# Patient Record
Sex: Female | Born: 1945 | Race: White | Hispanic: No | Marital: Married | Smoking: Never smoker
Health system: Southern US, Community
[De-identification: ages and names within clinical notes are randomized; demographics above are authoritative.]

## PROBLEM LIST (undated history)

## (undated) DIAGNOSIS — Z952 Presence of prosthetic heart valve: Secondary | ICD-10-CM

## (undated) DIAGNOSIS — M810 Age-related osteoporosis without current pathological fracture: Secondary | ICD-10-CM

## (undated) DIAGNOSIS — S301XXA Contusion of abdominal wall, initial encounter: Secondary | ICD-10-CM

## (undated) DIAGNOSIS — I739 Peripheral vascular disease, unspecified: Secondary | ICD-10-CM

## (undated) DIAGNOSIS — E039 Hypothyroidism, unspecified: Secondary | ICD-10-CM

## (undated) DIAGNOSIS — Z7901 Long term (current) use of anticoagulants: Secondary | ICD-10-CM

## (undated) DIAGNOSIS — F419 Anxiety disorder, unspecified: Secondary | ICD-10-CM

## (undated) DIAGNOSIS — R42 Dizziness and giddiness: Secondary | ICD-10-CM

## (undated) DIAGNOSIS — K922 Gastrointestinal hemorrhage, unspecified: Secondary | ICD-10-CM

## (undated) DIAGNOSIS — F329 Major depressive disorder, single episode, unspecified: Secondary | ICD-10-CM

## (undated) DIAGNOSIS — I272 Pulmonary hypertension, unspecified: Secondary | ICD-10-CM

## (undated) DIAGNOSIS — I639 Cerebral infarction, unspecified: Secondary | ICD-10-CM

## (undated) DIAGNOSIS — G510 Bell's palsy: Secondary | ICD-10-CM

## (undated) DIAGNOSIS — M25512 Pain in left shoulder: Secondary | ICD-10-CM

## (undated) DIAGNOSIS — L309 Dermatitis, unspecified: Secondary | ICD-10-CM

## (undated) DIAGNOSIS — S3011XA Contusion of abdominal wall, initial encounter: Secondary | ICD-10-CM

## (undated) DIAGNOSIS — I4891 Unspecified atrial fibrillation: Secondary | ICD-10-CM

## (undated) DIAGNOSIS — J309 Allergic rhinitis, unspecified: Secondary | ICD-10-CM

## (undated) HISTORY — DX: Age-related osteoporosis without current pathological fracture: M81.0

## (undated) HISTORY — DX: Unspecified atrial fibrillation: I48.91

## (undated) HISTORY — DX: Peripheral vascular disease, unspecified: I73.9

## (undated) HISTORY — DX: Pain in left shoulder: M25.512

## (undated) HISTORY — DX: Dermatitis, unspecified: L30.9

## (undated) HISTORY — DX: Pulmonary hypertension, unspecified: I27.20

## (undated) HISTORY — DX: Presence of prosthetic heart valve: Z95.2

## (undated) HISTORY — DX: Cerebral infarction, unspecified: I63.9

## (undated) HISTORY — DX: Contusion of abdominal wall, initial encounter: S30.11XA

## (undated) HISTORY — DX: Hypothyroidism, unspecified: E03.9

## (undated) HISTORY — DX: Allergic rhinitis, unspecified: J30.9

## (undated) HISTORY — DX: Bell's palsy: G51.0

## (undated) HISTORY — DX: Contusion of abdominal wall, initial encounter: S30.1XXA

## (undated) HISTORY — DX: Anxiety disorder, unspecified: F41.9

## (undated) HISTORY — DX: Dizziness and giddiness: R42

## (undated) HISTORY — DX: Major depressive disorder, single episode, unspecified: F32.9

## (undated) HISTORY — DX: Long term (current) use of anticoagulants: Z79.01

---

## 1998-06-25 ENCOUNTER — Ambulatory Visit (HOSPITAL_COMMUNITY): Admission: RE | Admit: 1998-06-25 | Discharge: 1998-06-25 | Payer: Self-pay | Admitting: Cardiology

## 1999-07-01 ENCOUNTER — Emergency Department (HOSPITAL_COMMUNITY): Admission: EM | Admit: 1999-07-01 | Discharge: 1999-07-01 | Payer: Self-pay | Admitting: *Deleted

## 1999-07-01 ENCOUNTER — Encounter: Payer: Self-pay | Admitting: Emergency Medicine

## 1999-07-02 ENCOUNTER — Encounter: Payer: Self-pay | Admitting: Emergency Medicine

## 1999-07-07 ENCOUNTER — Encounter: Payer: Self-pay | Admitting: Pulmonary Disease

## 1999-07-23 ENCOUNTER — Ambulatory Visit (HOSPITAL_COMMUNITY): Admission: RE | Admit: 1999-07-23 | Discharge: 1999-07-24 | Payer: Self-pay | Admitting: Cardiology

## 1999-07-24 ENCOUNTER — Encounter: Payer: Self-pay | Admitting: Surgery

## 1999-07-25 ENCOUNTER — Encounter (HOSPITAL_COMMUNITY): Admission: RE | Admit: 1999-07-25 | Discharge: 1999-10-23 | Payer: Self-pay | Admitting: Dentistry

## 1999-07-28 ENCOUNTER — Encounter: Payer: Self-pay | Admitting: Cardiothoracic Surgery

## 1999-07-29 ENCOUNTER — Inpatient Hospital Stay (HOSPITAL_COMMUNITY): Admission: RE | Admit: 1999-07-29 | Discharge: 1999-08-03 | Payer: Self-pay | Admitting: Cardiothoracic Surgery

## 1999-07-29 ENCOUNTER — Encounter: Payer: Self-pay | Admitting: Cardiothoracic Surgery

## 1999-07-30 ENCOUNTER — Encounter: Payer: Self-pay | Admitting: Cardiothoracic Surgery

## 1999-07-31 ENCOUNTER — Encounter: Payer: Self-pay | Admitting: Cardiothoracic Surgery

## 1999-08-16 ENCOUNTER — Emergency Department (HOSPITAL_COMMUNITY): Admission: EM | Admit: 1999-08-16 | Discharge: 1999-08-16 | Payer: Self-pay | Admitting: Emergency Medicine

## 1999-08-16 ENCOUNTER — Encounter: Payer: Self-pay | Admitting: Emergency Medicine

## 1999-09-19 ENCOUNTER — Encounter: Payer: Self-pay | Admitting: Pulmonary Disease

## 1999-09-30 ENCOUNTER — Ambulatory Visit: Admission: RE | Admit: 1999-09-30 | Discharge: 1999-09-30 | Payer: Self-pay | Admitting: Pulmonary Disease

## 1999-09-30 ENCOUNTER — Encounter: Payer: Self-pay | Admitting: Pulmonary Disease

## 1999-10-06 ENCOUNTER — Encounter: Payer: Self-pay | Admitting: Pulmonary Disease

## 1999-10-28 ENCOUNTER — Inpatient Hospital Stay (HOSPITAL_COMMUNITY): Admission: EM | Admit: 1999-10-28 | Discharge: 1999-10-30 | Payer: Self-pay | Admitting: *Deleted

## 2000-01-20 ENCOUNTER — Emergency Department (HOSPITAL_COMMUNITY): Admission: EM | Admit: 2000-01-20 | Discharge: 2000-01-20 | Payer: Self-pay | Admitting: Emergency Medicine

## 2000-10-05 ENCOUNTER — Encounter (HOSPITAL_COMMUNITY): Admission: RE | Admit: 2000-10-05 | Discharge: 2000-11-23 | Payer: Self-pay | Admitting: Cardiology

## 2000-11-13 ENCOUNTER — Emergency Department (HOSPITAL_COMMUNITY): Admission: EM | Admit: 2000-11-13 | Discharge: 2000-11-13 | Payer: Self-pay | Admitting: Emergency Medicine

## 2001-02-12 ENCOUNTER — Emergency Department (HOSPITAL_COMMUNITY): Admission: EM | Admit: 2001-02-12 | Discharge: 2001-02-12 | Payer: Self-pay | Admitting: Emergency Medicine

## 2001-02-12 ENCOUNTER — Encounter: Payer: Self-pay | Admitting: Emergency Medicine

## 2001-07-05 ENCOUNTER — Inpatient Hospital Stay (HOSPITAL_COMMUNITY): Admission: EM | Admit: 2001-07-05 | Discharge: 2001-07-09 | Payer: Self-pay | Admitting: *Deleted

## 2001-07-08 ENCOUNTER — Encounter: Payer: Self-pay | Admitting: Internal Medicine

## 2002-07-02 ENCOUNTER — Emergency Department (HOSPITAL_COMMUNITY): Admission: EM | Admit: 2002-07-02 | Discharge: 2002-07-02 | Payer: Self-pay | Admitting: Emergency Medicine

## 2003-07-10 ENCOUNTER — Emergency Department (HOSPITAL_COMMUNITY): Admission: EM | Admit: 2003-07-10 | Discharge: 2003-07-10 | Payer: Self-pay | Admitting: Emergency Medicine

## 2004-02-19 ENCOUNTER — Encounter (HOSPITAL_COMMUNITY): Admission: RE | Admit: 2004-02-19 | Discharge: 2004-05-19 | Payer: Self-pay | Admitting: *Deleted

## 2004-02-21 ENCOUNTER — Inpatient Hospital Stay (HOSPITAL_COMMUNITY): Admission: EM | Admit: 2004-02-21 | Discharge: 2004-02-23 | Payer: Self-pay | Admitting: Emergency Medicine

## 2004-02-26 ENCOUNTER — Ambulatory Visit (HOSPITAL_COMMUNITY): Admission: RE | Admit: 2004-02-26 | Discharge: 2004-02-26 | Payer: Self-pay | Admitting: *Deleted

## 2004-06-29 ENCOUNTER — Emergency Department (HOSPITAL_COMMUNITY): Admission: EM | Admit: 2004-06-29 | Discharge: 2004-06-29 | Payer: Self-pay | Admitting: Emergency Medicine

## 2004-07-02 ENCOUNTER — Emergency Department (HOSPITAL_COMMUNITY): Admission: EM | Admit: 2004-07-02 | Discharge: 2004-07-03 | Payer: Self-pay | Admitting: Emergency Medicine

## 2004-07-04 ENCOUNTER — Inpatient Hospital Stay (HOSPITAL_COMMUNITY): Admission: EM | Admit: 2004-07-04 | Discharge: 2004-07-13 | Payer: Self-pay | Admitting: Emergency Medicine

## 2004-07-24 ENCOUNTER — Encounter: Admission: RE | Admit: 2004-07-24 | Discharge: 2004-07-24 | Payer: Self-pay | Admitting: Internal Medicine

## 2005-03-27 ENCOUNTER — Emergency Department (HOSPITAL_COMMUNITY): Admission: EM | Admit: 2005-03-27 | Discharge: 2005-03-27 | Payer: Self-pay | Admitting: Emergency Medicine

## 2005-05-24 ENCOUNTER — Emergency Department (HOSPITAL_COMMUNITY): Admission: EM | Admit: 2005-05-24 | Discharge: 2005-05-24 | Payer: Self-pay | Admitting: Emergency Medicine

## 2007-04-11 ENCOUNTER — Encounter: Admission: RE | Admit: 2007-04-11 | Discharge: 2007-04-11 | Payer: Self-pay | Admitting: Internal Medicine

## 2007-04-18 ENCOUNTER — Other Ambulatory Visit: Admission: RE | Admit: 2007-04-18 | Discharge: 2007-04-18 | Payer: Self-pay | Admitting: Obstetrics and Gynecology

## 2007-05-09 ENCOUNTER — Encounter: Admission: RE | Admit: 2007-05-09 | Discharge: 2007-05-09 | Payer: Self-pay | Admitting: Internal Medicine

## 2007-09-15 ENCOUNTER — Ambulatory Visit: Payer: Self-pay | Admitting: *Deleted

## 2008-03-21 ENCOUNTER — Ambulatory Visit: Payer: Self-pay | Admitting: Pulmonary Disease

## 2008-03-21 DIAGNOSIS — R0602 Shortness of breath: Secondary | ICD-10-CM | POA: Insufficient documentation

## 2008-03-21 DIAGNOSIS — I059 Rheumatic mitral valve disease, unspecified: Secondary | ICD-10-CM | POA: Insufficient documentation

## 2008-03-23 ENCOUNTER — Encounter (INDEPENDENT_AMBULATORY_CARE_PROVIDER_SITE_OTHER): Payer: Self-pay | Admitting: *Deleted

## 2008-03-26 ENCOUNTER — Ambulatory Visit: Payer: Self-pay | Admitting: Pulmonary Disease

## 2008-03-27 ENCOUNTER — Encounter: Payer: Self-pay | Admitting: Pulmonary Disease

## 2008-04-10 ENCOUNTER — Telehealth (INDEPENDENT_AMBULATORY_CARE_PROVIDER_SITE_OTHER): Payer: Self-pay | Admitting: *Deleted

## 2008-04-16 ENCOUNTER — Ambulatory Visit: Payer: Self-pay | Admitting: Pulmonary Disease

## 2009-01-02 ENCOUNTER — Ambulatory Visit: Payer: Self-pay | Admitting: Vascular Surgery

## 2009-11-24 ENCOUNTER — Inpatient Hospital Stay (HOSPITAL_COMMUNITY): Admission: EM | Admit: 2009-11-24 | Discharge: 2009-11-27 | Payer: Self-pay | Admitting: Emergency Medicine

## 2009-11-24 ENCOUNTER — Emergency Department (HOSPITAL_COMMUNITY): Admission: EM | Admit: 2009-11-24 | Discharge: 2009-11-24 | Payer: Self-pay | Admitting: Emergency Medicine

## 2009-11-25 ENCOUNTER — Ambulatory Visit: Payer: Self-pay | Admitting: Vascular Surgery

## 2009-11-25 ENCOUNTER — Encounter (INDEPENDENT_AMBULATORY_CARE_PROVIDER_SITE_OTHER): Payer: Self-pay | Admitting: Internal Medicine

## 2010-01-02 ENCOUNTER — Encounter: Admission: RE | Admit: 2010-01-02 | Discharge: 2010-01-02 | Payer: Self-pay | Admitting: Orthopaedic Surgery

## 2010-01-17 ENCOUNTER — Encounter: Admission: RE | Admit: 2010-01-17 | Discharge: 2010-01-17 | Payer: Self-pay | Admitting: Gastroenterology

## 2010-05-08 ENCOUNTER — Encounter
Admission: RE | Admit: 2010-05-08 | Discharge: 2010-06-03 | Payer: Self-pay | Source: Home / Self Care | Attending: Orthopaedic Surgery | Admitting: Orthopaedic Surgery

## 2010-05-25 ENCOUNTER — Encounter: Payer: Self-pay | Admitting: Orthopaedic Surgery

## 2010-05-25 ENCOUNTER — Encounter: Payer: Self-pay | Admitting: Internal Medicine

## 2010-06-03 NOTE — Progress Notes (Signed)
----   Converted from flag ---- ---- 04/07/2008 12:30 PM, Barbaraann Share MD wrote: pt needs ov to discuss pfts ------------------------------  LMOMTCB.  Cyndia Diver LPN  April 10, 2008 2:24 PM   called and spoke with pt. pt is scheduled to see [facility] on monday at 2:00pm.  Cyndia Diver LPN  April 13, 2008 4:42 PM

## 2010-06-03 NOTE — Assessment & Plan Note (Signed)
Summary: ov to discuss PFT results/mg   PCP:  Donette Larry  Chief Complaint:  Pt is here for a f/u appt to discuss PFT results. .  History of Present Illness: the pt comes in today for f/u after her recent pfts and echo for w/u of doe.  Her pfts were totally normal, and her echo showed no significant pulmonary hypertension or abnormality of her prosthetic valve.  I have reviewed the pfts with the pt is great detail, and answered all of her questions.     Current Allergies: ! THORAZINE ! ADHESIVE TAPE      Vital Signs:  Patient Profile:   65 Years Old Female Weight:      142.13 pounds O2 Sat:      99 % O2 treatment:    Room Air Temp:     97.8 degrees F oral Pulse rate:   99 / minute BP sitting:   110 / 70  (right arm) Cuff size:   regular  Vitals Entered By: Cyndia Diver LPN (April 16, 2008 2:03 PM)             Comments Medications reviewed with patient Cyndia Diver LPN  April 16, 2008 2:03 PM      Physical Exam  General:     thin female in nad      Impression & Recommendations:  Problem # 1:  DYSPNEA (ICD-786.05) I can find no specific pulmonary etiology for the pt's doe.  She has a normal cxr, clear lung fields, and normal full pfts.  Her echo shows no pulmonary htn or abn. of her prosthetic valve.  At this point, I have asked her to work on some type of conditioning program for the next 2-3 mos and see how her symptoms change.  I would also wonder how much anxiety may be playing a role.   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

## 2010-06-03 NOTE — Letter (Signed)
Summary: [facility] Results Follow Up Letter  [facility]  [address]   [place], [place] [address]   Phone: [phone]  Fax: [phone]    03/23/2008 MRN: [id number]  [facility] [address] [place], [place]  [address]  Dear Ms. Carbonell,  We have received the results from your recent tests and have been unable to contact you.  Please call our office at [phone] so that Dr. Shelle Iron or his nurse may review the results with you.    Thank you,  Nature conservation officer Pulmonary Division

## 2010-06-03 NOTE — Progress Notes (Signed)
Summary: PFT normal,possible early hypothyroidism/LEB-Pulmo  PFT normal,possible early hypothyroidism/LEB-Pulmo   Imported By: Lester [place] 03/22/2008 08:26:58  _____________________________________________________________________  External Attachment:    Type:   Image     Comment:   External Document

## 2010-06-03 NOTE — Miscellaneous (Signed)
Summary: Orders Update  Clinical Lists Changes  Orders: Added new Service order of Carbon Monoxide diffusing w/capacity (94720) - Signed Added new Service order of Lung Volumes (94240) - Signed Added new Service order of Spirometry (Pre & Post) (94060) - Signed 

## 2010-06-03 NOTE — Progress Notes (Signed)
Summary: Dyspnea,fatigue,restrictive physiology by spirometry/LEB-Pulmo  Dyspnea,fatigue,restrictive physiology by spirometry/LEB-Pulmo   Imported By: Lester [place] 03/22/2008 08:24:22  _____________________________________________________________________  External Attachment:    Type:   Image     Comment:   External Document

## 2010-06-03 NOTE — Progress Notes (Signed)
Summary: dyspnea,pulmo hypertension,abn cxr/LEB-Pulmo  dyspnea,pulmo hypertension,abn cxr/LEB-Pulmo   Imported By: Lester [place] 03/22/2008 08:20:19  _____________________________________________________________________  External Attachment:    Type:   Image     Comment:   External Document

## 2010-06-03 NOTE — Assessment & Plan Note (Signed)
Summary: consult for dyspnea   Referred by:  Georgann Housekeeper PCP:  Georgann Housekeeper  Chief Complaint:  Pulmonary Consult.  History of Present Illness: the patient is a 65 year old female who I been asked to see for dyspnea. The patient gives a history for worsening shortness of breath over the last 3 months, but does not have dyspnea at rest. She describes less than one block dyspnea on exertion at a moderate pace on flat ground. She will also get winded bringing groceries in from the car, and vacuuming less than one room of her house. She has a history of PFTs in 2001 which were normal. She also has a history of mitral valve replacement, and is to have an echo with her cardiologist next week. She has not had a recent chest x-ray. The patient denies any significant cough or mucus production. She does state that her heart is "not beating right". She denies any worsening lower extremity edema.     Current Allergies: ! THORAZINE ! ADHESIVE TAPE  Past Medical History:    mitral valve stenosis, status post mitral valve, replacement, mechanical    Chronic Coumadin therapy    Depression    Hypothyroid    lower back pain    anxiety    rhinitis    myotonia, right eye    anemia   Family History:    emphysema: maternal grandfather    allergies: mother, father, siblings    heart disease: father (m.i. and stroke)    cancer: mother (breast)   Social History:    Patient never smoked.     pt is separated.    pt has children.    pt is currently on disability.    Risk Factors:  Tobacco use:  never   Review of Systems      See HPI   Vital Signs:  Patient Profile:   65 Years Old Female Weight:      144.13 pounds O2 Sat:      100 % O2 treatment:    Room Air Temp:     97.8 degrees F oral Pulse rate:   94 / minute BP sitting:   130 / 70  (right arm) Cuff size:   regular  Vitals Entered By: Cyndia Diver LPN (March 21, 2008 2:24 PM)             Is Patient Diabetic?  No Comments Medications reviewed with patient Cyndia Diver LPN  March 21, 2008 2:35 PM      Physical Exam  General:     thin female in nad Eyes:     PERRLA and EOMI.   Nose:     patent without discharge Mouth:     clear Neck:     no jvd, tmg, ln Lungs:     clear to auscultation, no wheezing or rhonchi Heart:     rrr, +valve click Abdomen:     soft and nontender, bs present Extremities:     no edema, pulses intact distally Neurologic:     alert and oriented, moves all 4 extremities.      Impression & Recommendations:  Problem # 1:  DYSPNEA (ICD-786.05) the pt complains of worsening dyspnea over the last 3mos.  She states that she had similar symptoms when she was having difficulties with her mitral valve.  There is nothing to suggest a pulmonary issue by history or exam, but will check cxr and pfts for completeness.  She is seeing her cardiologist next week to get an  echocardiogram.  Hopefully, this will give [place] some idea of her MV functioning, and an estimation of her PA pressures.   Patient Instructions: 1)  will check cxr today 2)  will schedule for breathing tests. 3)  I will contact you once results are available.   ]

## 2010-07-19 LAB — BASIC METABOLIC PANEL
BUN: 9 mg/dL (ref 6–23)
BUN: 9 mg/dL (ref 6–23)
CO2: 20 mEq/L (ref 19–32)
CO2: 25 mEq/L (ref 19–32)
Calcium: 9.4 mg/dL (ref 8.4–10.5)
Calcium: 9.7 mg/dL (ref 8.4–10.5)
Chloride: 104 mEq/L (ref 96–112)
Chloride: 107 mEq/L (ref 96–112)
Creatinine, Ser: 0.74 mg/dL (ref 0.4–1.2)
Creatinine, Ser: 0.79 mg/dL (ref 0.4–1.2)
GFR calc Af Amer: >60 mL/min (ref >60)
GFR calc Af Amer: >60 mL/min (ref >60)
GFR calc non Af Amer: >60 mL/min (ref >60)
GFR calc non Af Amer: >60 mL/min (ref >60)
Glucose, Bld: 104 mg/dL — ABNORMAL HIGH (ref 70–99)
Glucose, Bld: 122 mg/dL — ABNORMAL HIGH (ref 70–99)
Potassium: 4 mEq/L (ref 3.5–5.1)
Potassium: 4.2 mEq/L (ref 3.5–5.1)
Sodium: 137 mEq/L (ref 135–145)
Sodium: 138 mEq/L (ref 135–145)

## 2010-07-19 LAB — CBC
HCT: 42 % (ref 36.0–46.0)
HCT: 43.5 % (ref 36.0–46.0)
Hemoglobin: 14.6 g/dL (ref 12.0–15.0)
Hemoglobin: 15.1 g/dL — ABNORMAL HIGH (ref 12.0–15.0)
MCH: 29.9 pg (ref 26.0–34.0)
MCH: 30.4 pg (ref 26.0–34.0)
MCHC: 34.7 g/dL (ref 30.0–36.0)
MCHC: 34.8 g/dL (ref 30.0–36.0)
MCV: 86.2 fL (ref 78.0–100.0)
MCV: 87.2 fL (ref 78.0–100.0)
Platelets: 235 10*3/uL (ref 150–400)
Platelets: 250 10*3/uL (ref 150–400)
RBC: 4.88 MIL/uL (ref 3.87–5.11)
RBC: 4.99 MIL/uL (ref 3.87–5.11)
RDW: 13 % (ref 11.5–15.5)
RDW: 13.2 % (ref 11.5–15.5)
WBC: 7 10*3/uL (ref 4.0–10.5)
WBC: 7.4 10*3/uL (ref 4.0–10.5)

## 2010-07-19 LAB — POCT CARDIAC MARKERS
CKMB, poc: 1.8 ng/mL (ref 1.0–8.0)
CKMB, poc: 1.9 ng/mL (ref 1.0–8.0)
Myoglobin, poc: 103 ng/mL (ref 12–200)
Myoglobin, poc: 95.4 ng/mL (ref 12–200)
Troponin i, poc: <0.05 ng/mL (ref 0.00–0.09)
Troponin i, poc: <0.05 ng/mL (ref 0.00–0.09)

## 2010-07-19 LAB — CARDIAC PANEL(CRET KIN+CKTOT+MB+TROPI)
CK, MB: 2.2 ng/mL (ref 0.3–4.0)
CK, MB: 2.4 ng/mL (ref 0.3–4.0)
Relative Index: INVALID (ref 0.0–2.5)
Relative Index: INVALID (ref 0.0–2.5)
Total CK: 80 U/L (ref 7–177)
Total CK: 94 U/L (ref 7–177)
Troponin I: 0.01 ng/mL (ref 0.00–0.06)
Troponin I: 0.01 ng/mL (ref 0.00–0.06)

## 2010-07-19 LAB — TSH
TSH: 3.974 u[IU]/mL (ref 0.350–4.500)
TSH: 4.132 u[IU]/mL (ref 0.350–4.500)

## 2010-07-19 LAB — PROTIME-INR
INR: 2.99 — ABNORMAL HIGH (ref 0.00–1.49)
INR: 3.14 — ABNORMAL HIGH (ref 0.00–1.49)
INR: 3.36 — ABNORMAL HIGH (ref 0.00–1.49)
INR: 3.62 — ABNORMAL HIGH (ref 0.00–1.49)
Prothrombin Time: 30.8 seconds — ABNORMAL HIGH (ref 11.6–15.2)
Prothrombin Time: 32 seconds — ABNORMAL HIGH (ref 11.6–15.2)
Prothrombin Time: 33.8 seconds — ABNORMAL HIGH (ref 11.6–15.2)
Prothrombin Time: 35.8 seconds — ABNORMAL HIGH (ref 11.6–15.2)

## 2010-07-19 LAB — DIFFERENTIAL
Basophils Absolute: 0.1 10*3/uL (ref 0.0–0.1)
Basophils Relative: 1 % (ref 0–1)
Eosinophils Absolute: 0.1 10*3/uL (ref 0.0–0.7)
Eosinophils Relative: 2 % (ref 0–5)
Lymphocytes Relative: 26 % (ref 12–46)
Lymphs Abs: 1.8 10*3/uL (ref 0.7–4.0)
Monocytes Absolute: 0.6 10*3/uL (ref 0.1–1.0)
Monocytes Relative: 8 % (ref 3–12)
Neutro Abs: 4.4 10*3/uL (ref 1.7–7.7)
Neutrophils Relative %: 63 % (ref 43–77)

## 2010-07-19 LAB — POCT I-STAT, CHEM 8
BUN: 13 mg/dL (ref 6–23)
Calcium, Ion: 1.1 mmol/L — ABNORMAL LOW (ref 1.12–1.32)
Chloride: 107 mEq/L (ref 96–112)
Creatinine, Ser: 0.8 mg/dL (ref 0.4–1.2)
Glucose, Bld: 132 mg/dL — ABNORMAL HIGH (ref 70–99)
HCT: 44 % (ref 36.0–46.0)
Hemoglobin: 15 g/dL (ref 12.0–15.0)
Potassium: 4.2 mEq/L (ref 3.5–5.1)
Sodium: 140 mEq/L (ref 135–145)
TCO2: 22 mmol/L (ref 0–100)

## 2010-07-19 LAB — URINE MICROSCOPIC-ADD ON

## 2010-07-19 LAB — URINALYSIS, ROUTINE W REFLEX MICROSCOPIC
Bilirubin Urine: NEGATIVE
Glucose, UA: NEGATIVE mg/dL
Ketones, ur: NEGATIVE mg/dL
Leukocytes, UA: NEGATIVE
Nitrite: NEGATIVE
Protein, ur: NEGATIVE mg/dL
Specific Gravity, Urine: 1.005 (ref 1.005–1.030)
Urobilinogen, UA: 0.2 mg/dL (ref 0.0–1.0)
pH: 5.5 (ref 5.0–8.0)

## 2010-07-19 LAB — LIPID PANEL
Cholesterol: 180 mg/dL (ref 0–200)
HDL: 35 mg/dL — ABNORMAL LOW (ref >39)
LDL Cholesterol: 91 mg/dL (ref 0–99)
Total CHOL/HDL Ratio: 5.1 RATIO
Triglycerides: 268 mg/dL — ABNORMAL HIGH (ref <150)
VLDL: 54 mg/dL — ABNORMAL HIGH (ref 0–40)

## 2010-07-19 LAB — URINE CULTURE
Colony Count: NO GROWTH
Culture: NO GROWTH

## 2010-07-19 LAB — CK TOTAL AND CKMB (NOT AT ARMC)
CK, MB: 3 ng/mL (ref 0.3–4.0)
Relative Index: 2.8 — ABNORMAL HIGH (ref 0.0–2.5)
Total CK: 107 U/L (ref 7–177)

## 2010-07-19 LAB — HEMOGLOBIN A1C
Hgb A1c MFr Bld: 4.9 % (ref <5.7)
Mean Plasma Glucose: 94 mg/dL (ref <117)

## 2010-07-19 LAB — T4, FREE: Free T4: 1.58 ng/dL (ref 0.80–1.80)

## 2010-07-19 LAB — ALBUMIN: Albumin: 4.3 g/dL (ref 3.5–5.2)

## 2010-07-19 LAB — VITAMIN B12: Vitamin B-12: 404 pg/mL (ref 211–911)

## 2010-07-19 LAB — APTT: aPTT: 56 seconds — ABNORMAL HIGH (ref 24–37)

## 2010-07-19 LAB — TROPONIN I: Troponin I: 0.01 ng/mL (ref 0.00–0.06)

## 2010-09-16 NOTE — Assessment & Plan Note (Signed)
OFFICE VISIT   ONESTI, BONFIGLIO  DOB:  1945/12/16                                       01/02/2009  FAOZH#:[id number]   The patient is a 65 year old female referred by Dr. Magnus Ivan to evaluate  bluish discoloration of her toes.  She states that she gets some  cramping in her legs when lying down but this is usually improved with  walking.  She does not have any symptoms of claudication when  ambulating.  States that occasionally her right foot aches.  This  usually improves with rubbing it.  She also has a burning sensation in  her foot at times.  She denies history of alcohol abuse or diabetes.  She also has a history of peripheral neuropathy.   PAST SURGICAL HISTORY:  She had mitral valve replacement in 2001 and is  currently on Coumadin for that.   PAST MEDICAL HISTORY:  Otherwise fairly unremarkable.   FAMILY HISTORY:  Remarkable for her father who had vascular disease at a  young age.   SOCIAL HISTORY:  She is disabled due to dyspnea with exertion.  She is a  nonsmoker, nonconsumer of alcohol.   REVIEW OF SYSTEMS:  She is 5 feet 9 inches, 130 pounds.  CARDIAC:  She has dyspnea with exertion and walking.  She also has a  history of a murmur.  NEUROLOGIC:  She has some occasional headaches and dizziness.  ORTHOPEDIC:  She has multiple joint and arthritis pain.  PSYCHIATRIC:  She has mild anxiety and depression.  ENT, HEMATOLOGIC, RENAL, GI, PULMONARY REVIEW OF SYSTEMS:  Are all  negative.   MEDICATIONS:  Coumadin 2.5 mg once a day, Levoxyl 75 mg once a day,  Cymbalta 60 mg once a day.  She has no known drug allergies.   PHYSICAL EXAM:  Blood pressure is 142/83 in the right arm, pulse is 80  and regular.  HEENT:  Unremarkable.  Neck:  Has 2+ carotid pulses  without bruit.  Chest:  Clear to auscultation.  Cardiac exam:  Regular  rate and rhythm with no appreciable murmur.  Abdomen:  Soft, nontender,  no masses.  Extremities:  She has slight  decreased sensation in the  right plantar aspect of her foot.  She also has a bluish congested  appearance to the right foot.  She has 2+ radial and femoral pulses  bilaterally.  In the right foot she has a 1+ dorsalis pedis pulse with  absent posterior tibial pulse.  In the left foot she has a 1+ dorsalis  pedis pulse and a 2+ posterior tibial pulse.  She has no ulcerations on  the feet.  She has no significant edema in the extremities.   She had bilateral ABIs performed today which were 1.11 on the right and  1.07 on the left.  Waveforms are biphasic in character, these  represented normal ABIs bilaterally.   In summary, I agree with Dr. Magnus Ivan that the patient certainly has  some signs and symptoms of peripheral neuropathy.  The bluish  discoloration in her foot also may be due to some autonomic dysfunction.  I believe the best option for her would be continued conservative  management; however, I did write her a prescription today for Neurontin  to see if we could alleviate some of her neuropathic symptoms in her  feet.  She does  not seem to have any significant arterial occlusive  disease causing the bluish discoloration in her foot.  I reassured her  that she is not at risk of limb loss currently with the normal ABIs that  she has in her lower extremities.  She will follow up with me on an as-  needed basis.   Janetta Hora. Fields, MD  Electronically Signed   CEF/MEDQ  D:  01/09/2009  T:  01/10/2009  Job:  [id number]   cc:   Vanita Panda. Magnus Ivan, M.D.  Georgann Housekeeper, MD

## 2010-09-19 NOTE — H&P (Signed)
Jasmine Kelley, OVERLEY NO.:  [id number]   MEDICAL RECORD NO.:  [id number]          PATIENT TYPE:  EMS   LOCATION:  MAJO                         FACILITY:  [facility]   PHYSICIAN:  Nelva Nay, MD      DATE OF BIRTH:  1946/04/13   DATE OF ADMISSION:  02/21/2004  DATE OF DISCHARGE:                                HISTORY & PHYSICAL   PRIMARY CARE PHYSICIAN:  Jasmine Housekeeper, MD   CARDIOLOGIST:  Jasmine Kelley, M.D.   ADMITTING PHYSICIAN:  Jasmine Kelley, M.D.   CHIEF COMPLAINT:  Syncope and dizziness.   HISTORY OF PRESENT ILLNESS:  Jasmine Kelley is a 65 year old female patient  well known to me from our initial in the office last week on February 12, 2004. She came to the cardiologist with a known history of mitral valve  replacement and with complaints with persistent dyspnea and atypical chest  pain. She underwent an adenosine, Cardiolite study on February 19, 2004, that  demonstrated mild fixed thinning of the anterior apical apex and a small  perfusion defect of the anterolateral region which was suspicious for  ischemia. There was also noted to be moderate hypokinesis of the base of the  inferior wall. Due to the patient's St. Jude valve she is on chronic  Coumadin anticoagulation and plans are to proceed with catheterization today  on February 21, 2004, but unfortunately pre-cath labs drawn on October 19th  demonstrated a supertherapeutic INR of 4.93. The catheterization was  canceled and Coumadin was placed on hold. The patient states her last dose  was in the morning of October 19th. On her last visit to the office on  February 12, 2004, the patient was started on Lasix 20 mg daily due to  complaints of dyspnea, but she states she has only taken two doses, one  yesterday morning and one this morning.   In regards to today's symptoms, she was at home alone, was sitting on the  couch having some of  her usual type of leg cramps and felt the need to  void. She  walked to the bathroom, was feeling somewhat dizzy prior to going  into the bathroom, but nothing severe. She voided without difficulty. No  nausea or any symptoms while seated and voided. She began walking out of the  bathroom when she became extremely dizzy, nauseous, and slumped to the floor  over on her right side sustaining some mild bruising. Loss of consciousness  was less than one minute. The patient was fully aware she did not strike her  head. She got up immediately to her knees, regained her ability to stand,  was somewhat dizzy, but was able to get to her couch. She still had the cell  phone in her hand. She laid down, called EMS, and then called our office to  notify them of what had happened and then subsequently placed herself in a  supine position on the couch at which point dizziness resolved.  The patient  arrived to the ER and symptoms had resolved. She did report that once she  got to the couch  she had some atypical left lateral chest tightness that she  described as flutter-like, similar to prior symptoms she described in the  consultation note from our office of February 12, 2004. She reported to me  that prior to dizziness she was not having any chest pain or palpitations,  but later remembered when speaking with Dr. Mayford Knife that after the episode  she had a sensation of palpitations in her chest.   REVIEW OF SYSTEMS:  Essentially unchanged from the consultation note of  February 12, 2004. She is complaining of small superficial injuries to the  right upper extremity as well as to the right lower extremity, i.e., the  right elbow and right lateral leg near the knee. Again, she was complaining  of palpation after the episode, diffuse muscle aches with numbness and  tingling of the fingers and toes.  She denies any blood in urine or stools.  There is a question as to whether she is having some bleeding only when she  wipes at the rectal area.   FAMILY MEDICAL HISTORY:   Both of her sisters have depression. Mother died of  breast cancer in 88. Father died of heart disease and had diabetes at age  15.   SOCIAL HISTORY:  No tobacco use, only socially as a teenager. No alcohol.  Disabled secondary to dyspnea.   PAST MEDICAL HISTORY:  1.  St. Jude mitral valve replacement secondary to mitral stenosis in 2001,      Dr. Aleen Kelley.  2.  Chronic Coumadin anticoagulation secondary to mitral valve replacement,      followed by Dr. Venita Kelley office.  3.  Transesophageal echocardiogram in 2003 while hospitalized for syncope.      This was done by Dr. Amil Kelley. The patient had fever and no evidence of      vegetations were found.  4.  Moderate pulmonary hypertension noted on TEE in 2003. Pulmonary pressure      was 46 mmHg.  5.  Hypothyroidism.  6.  Anxiety and depression on Celexa and Ativan.  7.  Syncope in 2003 requiring hospitalization without definite etiology      found.   ALLERGIES:  Sudafed which causes agitation and a sensation of  disassociation.   CURRENT MEDICATIONS:  1.  Coumadin 5 mg every other day, currently on hold secondary to      supertherapeutic INR.  2.  Ativan 0.5 mg b.i.d.  3.  Celexa 40 mg daily.  4.  Levoxyl 75 mcg daily.   PHYSICAL EXAMINATION:  GENERAL: A pleasant, but anxious female currently  complaining of recent syncopal episode and mild musculoskeletal injuries  thereafter.  VITAL SIGNS: Temperature 97.5, BP 133/73 sitting in the right arm, pulse 72  and regular, respirations 16.  NEUROLOGIC: Alert and oriented without obvious focal neurologic deficits.  The patient is on a stretcher. Gait and ambulation not assessed. She is  moving all extremities times four and again no focal neurologic deficits.  HEENT: Head is normocephalic. No evidence of trauma. Sclera clear,  noninjected. Oral mucous membranes are pink and moist.  There is no  adenopathy palpated at the neck. CHEST: Bilateral lung sounds are clear to auscultation  posteriorly.  Respiratory effort is nonlabored. She is currently on nasal cannula O2  saturating at 100%.  CARDIAC: S1 and S2 with crisp mechanical heart valve sounds. No JVD.  Carotids 2+ bilaterally. No bruits.  Apical and radial pulses regular. She  is on the bedside telemetry monitor demonstrating normal sinus rhythm.  ABDOMEN: Soft, flat, nontender, and nondistended. No hepatosplenomegaly,  masses, or bruits. No evidence of trauma about the abdominal or chest wall  regions.  EXTREMITIES: Symmetrical in appearance. No edema, cyanosis, or clubbing.  Peripheral pulses are equally palpable as previous. She does have small  bruising and a small nonecchymotic knot on the plantar surface of the right  arm near the elbow region. She also has a small bruise on the right lateral  leg just below the knee area.   DIAGNOSTICS:  A 12-lead EKG has been done here in the ER showing sinus  rhythm, low voltage QRS in leads I, aVL, V1, and V2. Otherwise, with normal  R-wave progression. No ischemic changes. Normal EKG.   Labs from our office on February 20, 2004, which the electrolytes panel is  within normal limits except the sodium is 135, BUN normal at 12, creatinine  0.8, potassium 3.9. LFTs are normal. Hemoglobin 13.9, platelet count  280,000, and white count 6100. Pro time is 29.5, INR 4.93, and PTT 51. The  patient has one set of point of care marker drawn in the ER. Myoglobin 208  which is slightly elevated, troponin-I normal at less than 0.05, and CK-MB  is 1.5. I-STAT creatinine is normal at 1.0.  I-STAT lytes show hemoglobin of  15 which is almost 2 gm higher than yesterday's, sodium 137, potassium 3.8,  chloride 105, glucose 92, BUN 14. Blood pH is slightly alkalotic at 7.529,  PCO2 28.9, bicarbonate 24.1, PCO2 25.   CT of the head was done in the ER and shows no acute intracranial findings,  atrophy. Portable chest x-ray results are pending at the time of dictation.   IMPRESSION:  1.   Syncope and dizziness.  2.  Supertherapeutic INR in a patient on chronic Coumadin.  3.  Issues with chest pain and dyspnea, known abnormal adenosine Cardiolite      study with diagnostic coronary angiography pending.  4.  History of St. Jude mitral valve repair secondary to mitral stenosis in      2001.  5.  Moderate pulmonary hypertension with pulmonary pressure 46 mmHg in 2003.  6.  Hypothyroidism.  7.  Anxiety and depression.  8.  Prior episode of syncope requiring hospitalization in 2003 without      etiology determined.  9.  Numbness and tingling of the lower and upper extremities. Etiology      unclear. Anxiety versus electrolyte imbalance.   PLAN:  Dr. Mayford Knife has seen and evaluated the patient with me. We will  proceed as follows.  1.  Again, a CT of the head has been done in the ER and there is no evidence      of bleed or CVA as cause for syncope. 2.  Hemoglobin is normal, so no obvious etiology for syncope related to      probable anemia and patient without signs of active bleeding per her      report.  3.  We will go ahead and hold this patient's Coumadin due to      supertherapeutic INR. We will not give heparin or Lovenox until INR is      less than or equal to 2.0. We will probably need to contact case      management to see if the patient is eligible for Lovenox injection. So      we can use this as a bridge while the INR is subtherapeutic until we can      proceed with diagnostic  coronary angiography on this patient.  4.  Again, due to her history of abnormal Cardiolite and having syncope, we      need to check routine cardiac isoenzymes. EKG is normal and shows no      definite ischemic problems at the present time. Consider checking a      fasting lipid panel while the patient is hospitalized as well.  5.  Due to patient's recent usage of Lasix times only dosages and the fact      her hemoglobin has increased almost 2 gm, there is a consideration that      volume  depletion and orthostasis may have contributed to patient's      syncopal episode. Therefore, will check orthostatic vital signs,      maintain bedrest for this patient initially and have PT evaluate the      patient before proceeding with independent ambulation.  6.  Again the patient has been having issues with lower extremity cramps and      tingling of the extremities. Calcium is normal. Will go ahead and check      a magnesium and phosphorus to make sure electrolyte imbalances are not      contributing to this.  7.  Anxiety is a large component with patient's interpretation of her      symptoms. Therefore, make sure patient is receiving appropriate home      medications to treat this disorder as well as allowing for p.r.n. anti-      anxiety medications due to this acute change in patient's physical      status.  8.  With the syncopal episode and patient having abnormal Cardiolite,      unclear if Dr. Fraser Din will desire to keep the patient hospitalized and      proceed with catheterization once the INR is subtherapeutic. Will defer      timing of catheterization at this point to Dr. Fraser Din who will resume      care of this patient on Friday, October 21st.       ________________________________________  Kelle Darting. Rennis Harding, N.P.  ___________________________________________  Nelva Nay, MD    ALE/MEDQ  D:  02/21/2004  T:  02/21/2004  Job:  [id number]   cc:   Jasmine Housekeeper, MD  [address]  [place]  [place] [address]  Fax: [phone]   Mayford Knife, M.D

## 2010-09-19 NOTE — Consult Note (Signed)
NAMECHISA, Jasmine Kelley              ACCOUNT NO.:  [id number]   MEDICAL RECORD NO.:  [id number]          PATIENT TYPE:  INP   LOCATION:  3739                         FACILITY:  [facility]   PHYSICIAN:  Pramod P. Pearlean Brownie, MD    DATE OF BIRTH:  [birth year]   DATE OF ADMISSION:  02/21/2004  DATE OF DISCHARGE:                                   CONSULTATION   REASON FOR CONSULTATION:  Dizziness.   HISTORY OF PRESENT ILLNESS:  [name] is a 65 year old pleasant  Caucasian lady who had a transient episode of dizziness, numbness, and gait  ataxia yesterday evening. The patient states she got up from her bed and  noticed cramp-like pain with inverted deviation of the left foot. She had to  struggle to walk, but made it to the bathroom. After she used the bathroom  and when she got up she noticed sudden onset of vertigo and head spinning as  well as numbness in both hands and feet. She was off balance and when she  got up to wash her hands she fell down. She was not sure of whether she lost  consciousness or not, but eventually she was able to get and called for  help. She continued to feel dizzy and had numbness in her hands. This lasted  for an hour, by the time EMS got her and brought her to the hospital. In the  emergency room she felt better and all her symptoms resolved completely.  This morning she has felt fine. She had also complained of nausea during  this episode. She denies any previous other episodes or history of stroke or  TIAs, except two similar episodes in 2001 and 2003. The first episode, which  occurred a few weeks after having the St. Jude mitral valve replaced she had  a similar episode of dizziness with nausea and at that time she passed out  over her bed. Her daughter was at home and she was out at least for several  minutes. She was admitted, was thought to have had syncope, and cardiac  workup was initiated. She had a second episode in 2003 and she also passed  out for a  short period of time, but had a similar episode of vertigo,  nausea, and tingling. She had not had any neurological workup for these  episodes as yet.   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   HOME MEDICATIONS:  1.  Coumadin.  2.  Lasix.  3.  Celexa.  4.  Ambien.  5.  Zofran.  6.  Magnesium.  7.  Imodium.   MEDICATION ALLERGIES:  Chlorpromazine.   FAMILY HISTORY:  Significant for myocardial infarction on the father's side  of the family in the 47s. No history of stroke or TIA.   SOCIAL HISTORY:  She lives with her son. She does not smoke or drink. She is  independent in her activities of daily living.   REVIEW OF SYSTEMS:  Not significant for recent chest pain, shortness of  breath, cough,  or diarrhea.   PHYSICAL EXAMINATION:  GENERAL: A pleasant Caucasian lady who is  comfortable, not in distress.  VITAL SIGNS: She is afebrile with temperature of 99, pulse 65 per minute and  regular, respiratory rate 20 per minute, blood pressure 99/65. Saturation  100% on room air. The patient's blood pressure in the supine position is  139/83 with pulse of 82 and when she stands it drops slightly to 126/81 with  pulse increasing to 88, which is not symptomatic.  CARDIAC: No murmur or gallop.  LUNGS: Clear to auscultation.  ABDOMEN: Soft and nontender.  NEUROLOGIC: She is awake, alert, and oriented times three with normal speech  and language function. There is no aphasia, apraxia, and dysarthria. Pupils  equal, round, and reactive to light and accommodation. Visual acuity and  fields adequate. Face is symmetric. Palate moves normally. Tongue is  midline. Motor/sensory exam reveals symmetric upper and lower extremity  strength. Tone and reflexes are 3+ bilaterally and symmetric. Plantars are  both downgoing. Finger to nose and heel-toe coordination accurate. She walks   with a slow steady gait. She has difficulty with tandem walking and while  standing idle with foot unsupported. She does complain of some chronic  tinnitus in both ears and has mild likely peripheral vestibular dysfunction.   DATA REVIEWED:  CT scan of the head, noncontrast study, done today is  normal.   Electrolytes are normal. INR is 4.7.   IMPRESSION:  A 65 year old lady with transient episode of dizziness,  vertigo, and numbness likely secondary to posterior circulation TIA. She has  had two similar episodes of the past which were felt to be syncopal episodes  and had a negative cardiac reevaluation. She has not yet had a neurologic  evaluation.   PLAN:  1.  I would recommend neurologic evaluation with MRI scan of the brain and      MRA of the brain and neck.  2.  Doppler studies, check fasting lipid profile, and hemoglobin A1c.  3.  Continue Coumadin for stroke prevention for now.  4.  I will be happy to follow the patient and will call if questions.        ___________________________________________  Jasmine Kelley. Pearlean Brownie, MD    PPS/MEDQ  D:  02/22/2004  T:  02/22/2004  Job:  [id number]

## 2010-09-19 NOTE — Discharge Summary (Signed)
Jasmine Kelley, Jasmine Kelley              ACCOUNT NO.:  [id number]   MEDICAL RECORD NO.:  [id number]          PATIENT TYPE:  INP   LOCATION:  4714                         FACILITY:  [facility]   PHYSICIAN:  Georgann Housekeeper, MD      DATE OF BIRTH:  Jan 05, 1946   DATE OF ADMISSION:  07/04/2004  DATE OF DISCHARGE:  07/13/2004                                 DISCHARGE SUMMARY   DISCHARGE DIAGNOSES:  1.  Retroperitoneal hematoma and retroperitoneal bleed.  2.  Rectus sheath hematoma.  3.  Anemia secondary to #1 and 2.  4.  History of mitral valve replacement, St. Jude's, on anticoagulation with      Coumadin.  5.  Anxiety and depression.  6.  Fevers.   MEDICATIONS ON DISCHARGE:  1.  Coumadin, as per [facility].  2.  Xanax 0.5 b.i.d.  3.  Home medications, which included Celexa 40 mg daily; Levoxyl 75 mcg      daily.   Please see H&P for details.  The patient is a 65 year old with a history of  St. Jude's mitral valve replacement in 2001, on Coumadin, who came in  basically with abdominal pain and nausea.   1.  Abdominal pain.  The patient had a CT scan done which showed a large      rectus sheath hematoma which bled into her peritoneum.  Her INR was      found to be 5.7.  She was __________  in the emergency room for      antibiotics for UTI on Macrobid.  The patient's Coumadin was held.  Her      hemoglobin initially was 14 and dropped down to 8.1.  Required 2 units      of blood.  Hemoglobin remained stable at 10-11 range.  She had a      subsequent CT scan that showed stable hematoma without any subsequent      bleed.  Because of her mitral valve replacement with St. Jude's, she was      started on heparin low dose without bolus by the pharmacy and managed      closely.  Her Coumadin was still on hold.  Her hemoglobin remained      stable in the 11-12 range.  She had significant abdominal pain and      required some pain medication initially, and slowly improved.  2.  She had a fever  during the hospital course.  White count remained      normal.  She had workup including chest x-ray and urine, all negative.      Blood culture was sent.  Was negative.  She was started empirically on      Rocephin because of the valve.  After the cultures were negative in five      to seven days, her antibiotic was stopped.  The patient remained      afebrile, hemodynamically stable.  3.  As far as her constipation, she required some stool softeners and      laxative during the hospital course.  Once the bleeding was stable, she  was restarted back on Coumadin slowly, and her INR at the time of      discharge was 1.4, which was to be followed up at the [facility] in      one day, on July 14, 2004.  The goal was to keep the INR between 2-2.5      because of her recent bleed.  4.  As far as her anxiety and depression, routinely on Celexa and Xanax.  5.  Hypothyroidism.  Continued on Levoxyl.   INSTRUCTIONS:  Not to do any lifting or extraneous work.   FOLLOWUP:  The patient will be followed up at the clinic in one week.      KH/MEDQ  D:  09/05/2004  T:  09/05/2004  Job:  [id number]

## 2010-09-19 NOTE — Procedures (Signed)
[place]. [facility]  Patient:    Jasmine Kelley, HUSBAND                       MRN: [id number] Adm. Date:  [id number] Attending:  Waldo Laine CC:         Anesthesia Department                           Procedure Report  DATE OF BIRTH:  1946/02/10.  PROCEDURE:  Intraoperative transesophageal echocardiography.  DESCRIPTION OF PROCEDURE:  [name] was brought to the operating room today by Dr. Tyrone Sage for likely replacement of her mitral valve, which was known to be stenotic by prior examination, both on catheterization and 2-D echo.  After successful induction of general anesthesia and endotracheal intubation, the [employer] transesophageal echo probe was passed through the oral cavity into the esophagus.  Several passes were required, as some obstruction was met initially.  Quite a bit of flexion of the head was required to pass the probe. It was easily passed and was atraumatic except for a small bruise on the left upper lip.  The left ventricle was easily seen and was found to be contracting in all four walls quite vigorously without difficulty.  The mitral valve was examined, and oth leaflets were intact; however, on color flow exam it was quite obvious that there was a great deal of stenotic flow across the valve with a typical mosaic pattern recurring in diastole.  The left atrium was obviously dilated, and the left atrial appendage was also quite enlarged.  There was no smoke or thrombus noted in the  appendage.  There was not a great deal of calcium surrounding the annulus of the valve.  The right atrium was seen to be functioning normally and was not enlarged.  The  interatrial septum was examined by color flow technique, and no patent foramen ovale could be ascertained.  The tricuspid valve was not well seen.  The aortic valve was examined and noted to have three cusps, and they were all approximating  well.  Color flow exam in both the short and long axis showed perhaps 1+ regurgitation.  The patient was placed on cardiopulmonary bypass and the mitral valve was replaced with a mechanical valve by Dr. Tyrone Sage.  After completion of bypass, the heart was slightly filled and the valve was noted to be seated in good position.  Both portions of the movable valve were functioning well, corresponding to the need for movement of blood through the heart.  There  were typical echogenic areas _____ by the valvular components.  On color flow exam, there were two small regurgitant and peripheral jets noted around the valve. There was no central jet noted.  The left ventricle was functioning quite well, and maneuvers were carried out to remove air from the left atrium.  The patient continued to be weaned from bypass and finally the cannulas were completely removed.  The patient progressed throughout the course of the operation to closure of the chest.  The use of pressors was avoided.  The probe was removed at the end of the procedure in an atraumatic fashion.  The patient was taken to the SICU in good condition. DD:  07/29/99 TD:  07/30/99 Job: [id number]

## 2010-09-19 NOTE — H&P (Signed)
Jasmine Kelley, LAPE              ACCOUNT NO.:  [id number]   MEDICAL RECORD NO.:  [id number]          PATIENT TYPE:  INP   LOCATION:  1831                         FACILITY:  [facility]   PHYSICIAN:  Meade Maw, M.D.    DATE OF BIRTH:  1945-07-12   DATE OF ADMISSION:  02/21/2004  DATE OF DISCHARGE:                                HISTORY & PHYSICAL   ADDENDUM:  This is an addendum to the history and physical exam just  dictated.  Dictation [id number].   1.  INR is still supratherapeutic at 4.9.  2.  Addition to medication list:  The patient was on Lasix 20 mg p.o. daily.      Alli   ALE/MEDQ  D:  02/21/2004  T:  02/21/2004  Job:  [id number]

## 2010-09-19 NOTE — H&P (Signed)
[place]  Patient:    Jasmine Kelley, Jasmine Kelley                       MRN: [id number] Adm. Date:  [id number] Attending:  Silvestre Mesi Dictator:   Donzetta Matters, P.A.                         History and Physical  DATE OF BIRTH:  1945/06/11  CHIEF COMPLAINT:  Shortness of breath.  HISTORY OF PRESENT ILLNESS:  This 65 year old female that has history of mitral  valve stenosis that was at least moderate via catheterization on June 25, 1998. She had 2-D echo done on June 16, 1999 which did show moderate mitral valve  stenosis.  She began having shortness of breath approximately June 30, 1999 and was seen at the hospital.  Afterwards, she had follow-up with Dr. Jayme Cloud, pulmonary, for a pulmonary consult, was told that her mitral valve was the probable cause of her shortness of breath.  She was then re-seen in our office and scheduled for heart catheterization today.  She now has some symptomatic palpitations. Also has anxiety/depression, dyspnea on exertion, has mild pulmonary hypertension, rheumatic valve disease with stenosis, and left atrial enlargement.  Other surgical history includes appendectomy, right eye surgery for crossed eyes, heart catheterization previously.  DRUG ALLERGIES:  THORAZINE, which caused dystonia and severe reaction, per the patient.  CURRENT MEDICATIONS: 1. Coated aspirin daily. 2. Paxil DD:  07/23/99 TD:  07/23/99 Job: [id number]
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
## 2010-09-19 NOTE — H&P (Signed)
[place]. [facility]  Patient:    Jasmine Kelley, Jasmine Kelley Visit Number: [id number] MRN: [id number]          Service Type: MED Location: [phone] Attending Physician:  Georgann Housekeeper Dictated by:   Tyson Dense, M.D. Admit Date:  07/05/2001                           History and Physical  CHIEF COMPLAINT:  Fever and passed out.  HISTORY OF PRESENT ILLNESS:  This 65 year old white female, here with a past medical history of mitral valve replacement and anxiety.  She came to the emergency room after she had an episode of passing out this morning.  She had a fever of 101-102 degrees for the last two days, and also noted to be febrile in the emergency room.  She has some nonproductive cough for two days, and a sore throat.  This morning when she was coming out of the bathroom, she came down to the bed and fell, and reportedly was passed out for a few minutes. She felt dizzy and denies any chest pain but has some dyspnea on exertion.  No palpitations, and she has been having some vomiting for the last day, with a poor p.o. intake.  She had some body aches also.  In the emergency room she was febrile with tachycardia.  She was alert and oriented.  She gives a history that she has dyspnea on exertion since her mitral valve replacement.  For the last couple of days it has been a little worse.  No diarrhea, no urinary symptoms, no headache.  CURRENT MEDICATIONS 1. Coumadin 5 mg, alternating with 2.5 mg. 2. Levoxyl 50 mcg q.d. 3. Ativan 0.5 mg, one to two tab q.d. p.r.n.  ALLERGIES:  THORAZINE.  PAST MEDICAL HISTORY 1. History of depression and anxiety. 2. History of mitral valve replacement for mitral insufficiency in    February 2001. 3. History of hypothyroidism. 4. History of dyspnea on exertion.  PAST SURGICAL HISTORY:  Status post mitral valve replaced in 2001.  FAMILY HISTORY/SOCIAL HISTORY:  Positive for depression in sisters.  Mother died at age  83, with breast cancer.  Father died of heart disease and diabetes mellitus.  She is married, with three children.  She used to work at the [employer].  Now she is on disability since her valve replacement surgery.  No tobacco or alcohol.  No recreational drugs.  REVIEW OF SYSTEMS:  As mentioned above.  PHYSICAL EXAMINATION  VITAL SIGNS:  Blood pressure 109/75, temperature 102-104 degrees, pulse 90-110, tachycardia, sinus, respirations 18-20.  The saturations 97%-100% on room air.  GENERAL:  A well-developed awake female, in no acute distress.  HEENT:  Pupils equal, reactive.  Extraocular muscles intact.  Sclerae anicteric.  Oropharynx:  Mildly dry mucous membranes.  NECK:  Supple.  No jugular venous distention.  LUNGS:  Clear without wheezing.  CARDIAC:  Regular, S1, S2.  A 2/6 systolic murmur.  ABDOMEN:  Soft without any tenderness.  EXTREMITIES:  No edema.  NEUROLOGIC:  Nonfocal.  LABORATORY DATA:  White count 5.6, hemoglobin 13.8.  Electrolytes:  Sodium 139, potassium 4.2, BUN 11, creatinine 0.9, glucose 130, bicarbonate 19.  CPK 103, troponin and MB negative.  Urinalysis was negative.  Rapid strep and mono spot test were negative. INR 1.6.  Blood cultures were sent.  Chest x-ray:  Negative.  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  Electrocardiogram showed a normal sinus rhythm, rate of 110, without ST-T changes.  ASSESSMENT:  A 65 year old female with a history of mitral valve replacement, admitted with a fever and possible syncope, and some dyspnea and dehydration.  IMPRESSION/PLAN 1. Fever:  Question is whether it is a pulmonary process, bronchitis,    versus viral illness.  Given the history of a mitral valve replacement    and fever, I will get blood cultures and echocardiogram to rule out    subacute bacterial endocarditis.  Put on Tequin p.o. 400 mg q.d. and    IV fluids.  Check an ABG.  Follow the blood  cultures. 2. Syncope:  Question cardiac versus secondary to a primary illness.    Negative CPK.  She is tachycardic in the emergency room, secondary to    her fever.  A head CT was negative.  I will get an echocardiogram and    also get a cardiology evaluation if she has any more further symptoms. Dictated by:   Tyson Dense, M.D. Attending Physician:  Georgann Housekeeper DD:  07/05/01 TD:  07/05/01 Job: [id number]

## 2010-09-19 NOTE — Discharge Summary (Signed)
[place]  Patient:    Jasmine Kelley, Jasmine Kelley                       MRN: [id number] Adm. Date:  [id number] Disc. Date: 08/03/99 Attending:  Waldo Laine Dictator:   Areta Haber, P.A.-C. CC:         Gwenith Daily. Tyrone Sage, M.D.             John R. Aleen Campi, M.D.                           Discharge Summary  HISTORY OF PRESENT ILLNESS:  This is a 65 year old female with a known history f mitral valve stenosis that was at least moderate by cardiac catheterization in February, 2000.  At that time, she had had shortness of breath but, over the past three months, has noted marked increased shortness of breath with three-pillow orthopnea, hand edema, marked dyspnea with exertion to the point where very minimal exertion was causing shortness of breath.  The patient has also noted increasing fatigue requiring her to leave work because of exhaustion.  She has also had episodes of faintness but no syncope.  She denies any recent palpitations but has noted them in the past.  Because of the increase in symptoms, she came to the emergency room on February 27 and was referred to Dr. Jayme Cloud for a pulmonary consult and then referred back to Dr. Aleen Campi for further evaluation of her mitral valve.  Cardiac catheterization was performed on July 23, 1999, by Dr. Aleen Campi which demonstrated significant mitral stenosis with a mitral valve area of 1.3,  mitral valve gradient of 10.4.  There was no aortic valve gradient.  PA pressures were 36/18 with a mean of 27.  Pulmonary capillary wedge pressure was 20 and cardiac index was 2.5.  She was referred in cardiac surgical consultation to Sheliah Plane, M.D., who evaluated the patient and her studies, and agreed with recommendations for mitral valve replacement surgery.  She was admitted this hospitalization for the procedure.  PAST MEDICAL HISTORY:  History of anxiety and depression disorder, history  of dyspnea on exertion, history of pulmonary hypertension, history of mitral stenosis. Past surgical history includes appendectomy and right eye surgery.  ALLERGIES:  Include THORAZINE which caused severe dystonia.  ADMISSION MEDICATIONS: 1. Two aspirin every morning. 2. Paxil 40 mg q.d. 3. Xanax 0.5 mg q.h.s. 4. Altace 5 mg q.d.  FAMILY HISTORY, SOCIAL HISTORY, REVIEW OF SYSTEMS, PHYSICAL EXAMINATION: Please see the history and physical done at the time of admission.  HOSPITAL COURSE:  Patient was admitted as stated electively on July 29, 1999, nd was taken to the cardiac operating room where she underwent mitral valve replacement with a #31 St. Jude mitral valve.  Additionally, she underwent transesophageal echocardiography.  Procedure was performed by Dr. Sheliah Plane, tolerated well, and she was taken to the surgical intensive care unit in stable  condition.  Postoperative hospital course:  Patient has done well.  She has had  postoperative atrial fibrillation but has been chemically cardioverted with digoxin and beta blockers.  She currently maintains normal sinus rhythm.  She was weaned from the ventilator and extubated without difficulty.  She has been stable from a laboratory viewpoint although does have a postoperative anemia and has been started on folic acid and iron supplementation.  The patients chest tubes were discontinued in a routine manner without difficulties.  Oxygen  has been weaned nd she maintains good saturations on room air.  Incisions are healing well without  signs of infection.  She has been started on Coumadin but noted a quick bump in her INR to 3.0 after one dose of 5 mg Coumadin.  On August 02, 1998, her INR was 3.1 and she was given 1 mg of Coumadin and this will be rechecked in the a.m. prior to discharge to determine a home Coumadin dosing regimen.  She is tolerating cardiac rehabilitation, phase I modalities without significant  difficulties.  Overall, he is felt to be tentatively stable for discharge in the morning of August 03, 1999,  pending morning round reevaluation.  LABORATORY DATA:  Most recent laboratories showed a August 02, 1999, sodium of 137, potassium 3.7, chloride 106, CO2 27, BUN 13, creatinine 0.6, glucose 111, hemoglobin 9.2, hematocrit 25.4, white blood cell count 10.3, platelet count 262,000.  DISCHARGE MEDICATIONS 1. Coumadin, dosage to be determined at the time of discharge. 2. Folic acid 1 mg daily. 3. Ferrous gluconate 300 mg b.i.d. 4. Multivitamin one daily. 5. Atenolol 12.5 mg b.i.d. 6. Digoxin 0.125 mg q.d. 7. Darvocet one to two q.4-6h. p.r.n.  FINAL DIAGNOSES: 1. Mitral stenosis. 2. History of depression. 3. History of pulmonary hypertension. 4. Status post appendectomy. 5. Status post right eye surgery. 6. Postoperative atrial fibrillation. 7. Postoperative anemia.  FOLLOW-UP:  Follow-up will include Dr. Tyrone Sage in three weeks and Dr. Aleen Campi in two weeks.  Follow up INR at Dr. Star Age office on August 06, 1999.  CONDITION ON DISCHARGE:  Stable and improving. DD:  08/02/99 TD:  08/02/99 Job: [id number]

## 2010-09-19 NOTE — Consult Note (Signed)
. [facility]  Patient:    Jasmine Kelley, Jasmine Kelley                       MRN: [id number] Proc. Date: 07/24/99 Adm. Date:  [id number] Disc. Date: [id number] Attending:  Silvestre Mesi CC:         Aram Candela. Aleen Campi, M.D.             Gwenith Daily Tyrone Sage, M.D.             Cherlynn Perches, D.D.S.                          Consultation Report  DATE OF BIRTH: [birth year]  REQUESTING PHYSICIANS:  Gwenith Daily. Tyrone Sage, M.D. and Aram Candela. Aleen Campi, M.D.  HISTORY OF PRESENT ILLNESS: Jasmine Kelley is a 65 year old female referred by r. Tyrone Sage and Dr. Aleen Campi for a dental consultation.  The patient has mitral valve stenosis and is scheduled for a mitral valve replacement heart surgery pending  dental consultation.  Dental consultation is performed as part of a pre mitral valve replacement dental protocol.  PAST MEDICAL HISTORY:  1. Moderate mitral stenosis.     a. Increased symptomatology of shortness of breath, marked fatigue, and        heart palpitations.     b. Status post cardiac catheterization by Dr. Aleen Campi on July 23, 1999.     c. Anticipated mitral valve replacement surgery by Dr. Tyrone Sage.  2. Mild pulmonary hypertension.  3. History of anxiety and depression.  4. Status post appendectomy.  5. Status post right eye surgery to correct crossed eyes.  ALLERGIES/ADVERSE DRUG REACTIONS:  1. THORAZINE, dystonic reaction.  2. BEE STINGS, "severe swelling".  MEDICATIONS (per [facility]):  1. Xanax 0.5 mg 1 at bedtime.  2. Altace 5 mg q.d.  3. Paxil 40 mg q.d.  SOCIAL HISTORY:  The patient is married.  The patient denies history of smoking. The patient has occasional use of alcohol by report.  No other drug use.  FAMILY HISTORY:  Father is deceased at age 101 with history of CVA, heart disease, and diabetes mellitus.  Mother is deceased at age 61 from breast cancer.  FUNCTIONAL ASSESSMENT:  The patient was independent for ALDs prior to  this admission.  REVIEW OF SYSTEMS (reviewed from the History and Physical/chart - this admission and is included in dental consultation record).  DENTAL HISTORY:  CHIEF COMPLAINT: The patient needs dental consultation prior to mitral valve replacement surgery.  HISTORY OF PRESENT ILLNESS: Patient with moderate to severe mitral stenosis per recent cardiac catheterization by Dr. Aleen Campi.  The patient needs a mitral valve replacement surgery by Dr. Tyrone Sage. The patient is seen as part of a pre mitral valve replacement heart surgery to ule out dental infection prior to the heart surgery.  The patient currently denies toothaches, swelling, or abscess formation.  The patient has an upper left tooth which broke off at the gum line.  The patient points to tooth #15.  The patient was last seen by Dr. Cherlynn Perches (private dentist) for a dental consultation and treatment plan with radiographs in approximately November 2000 by patient report.  The patient has not had any of he proposed treatment due to financial concerns.  The patient indicates that her wisdom teeth were extracted "many years ago" without complications.  GENERAL EXAMINATION:  GENERAL:  The patient is a well-developed, well-nourished female in no acute distress.  VITAL SIGNS:  Blood pressure 95/67, pulse 68.  HEENT/NECK:  There is no palpable lymphadenopathy.  There are no acute TMJ symptoms by report.  The patients jaw deviates from left to right on maximum opening. There is no TMJ crepitus.  DENTAL EXAMINATION  INTRAORAL EXAMINATION:  The patient has normal saliva.  The patient has a palatal torus.  The patient has bilateral mandibular tori.  There is no evidence of soft tissue pathology.  DENTITION:  There are multiple missing teeth, #1, #16, #17, and #32.  Tooth #15 is present as root segments, with previous root canal therapy.  The lower anterior  crowded dentition is noted.  PERIODONTAL:  The  patient has chronic periodontal disease with plaque and calculus accumulation/accretions, incipient gingival recession, and incipient bone loss.  DENTAL CARIES:  There are multiple dental caries and suboptimal restorations which are noted as per dental charting form.  ENDODONTIC:  There is no history of acute pulpitis symptoms.  There is previous  root canal therapy of tooth #15 approximately ten years ago.  Tooth #15 is now present as root segments with exposure of the previous root canal therapy to the oral environment.  There is also previous root canal therapy to tooth #8 and #10. There is no apparent periapical pathology noted.  CROWN AND BRIDGE:  There are multiple PFM crowns on tooth #8 and #9 which are clinically acceptable.  The patient needs evaluation for future crown and bridge therapy.  OCCLUSION:  The patient has a poor occlusal scheme secondary to multiple missing teeth, root segment #15, and lower anterior crowded dentition.  RADIOGRAPHIC INTERPRETATION:  (Panoramic x-ray was taken on July 24, 1999 and supplemented with a full series of dental radiographs in the hospital dental clinic).  Multiple missing teeth #1, #16, #17, and #32.  There are root segments #15. There is incipient horizontal and vertical bone loss.  There is radiographic calculus  which is noted.  There are multiple amalgam, resin, and crown and bridge restorations, several of which have recurrent caries.  There are previous root canal therapies to tooth #8, #10, and #15.  ASSESSMENT:  1. Plaque and calculus accumulations/accretions.  2. Chronic periodontal disease with bone loss.  3. Gingival recession, incipient.  4. Dental caries and suboptimal restorations.  5. Root segments, #15.  6. Missing tooth #1, #16, #17, and #32.  7. Crowded lower anterior dentition.  8. Poor occlusal scheme secondary to #5 through #7 as above.  9. Previous root canal therapies of tooth #8, #10, and #15 with  no apparent     periapical pathology.  10. Need for evaluation of future crown and bridge restoration.  PLAN/RECOMMENDATIONS:  1. I discussed the risks, benefits, and complications of various treatment     options in light of the medical and dental problems of the patient, as well     as anticipated mitral valve replacement surgery.  I discussed extraction of     tooth #15 with alveoloplasty, periodontal therapy, dental restorations, crown     and bridge therapy, root canal therapy, and future fabrication of dental     prostheses.  The patient agrees to proceed with extraction of tooth #15 with     alveoloplasty, periodontal therapy, and restoration of tooth #10 as time and     health permit.  The patient will follow up with her private dentist for     subsequent periodontal therapy, dental restorations, and fabrication of future     crown and bridge restorations.  2. Discussion of findings with Dr. Aleen Campi and Dr. Tyrone Sage to confirm the     ability/stability of the patient to proceed with the treatment plan proposed,     need for SBE antibiotic prophylaxis, and follow-up coordination of the mitral     valve replacement surgery.  3. Will provide written and verbal information on "heart valves and the mouth" -     today.  4. Will initiate chlorhexidine rinse 0.12% - rinsing with 15 mls b.i.d.  Will     have the patient spit out the excess and not swallow. DD:  07/25/99 TD:  07/25/99 Job: [id number]

## 2010-09-19 NOTE — Cardiovascular Report (Signed)
Jasmine Kelley, Jasmine Kelley              ACCOUNT NO.:  [id number]   MEDICAL RECORD NO.:  [id number]          PATIENT TYPE:  OIB   LOCATION:  2899                         FACILITY:  [facility]   PHYSICIAN:  Meade Maw, M.D.    DATE OF BIRTH:  1945/12/09   DATE OF PROCEDURE:  DATE OF DISCHARGE:                              CARDIAC CATHETERIZATION   INDICATIONS:  Small region anterolateral ischemia on Cardiolite, ongoing  chest pain, shortness of breath.   PROCEDURE:  After obtaining written informed consent the patient was brought  to the cardiac catheterization laboratory in a postabsorptive state.  Preoperative sedation was achieved using IV Versed.  Of note, the patient's  Coumadin was held.  She was started on Lovenox.  Her INR was noted to be 1.5  at time of catheterization.  A 6-French hemostasis sheath was placed into  the right femoral artery using a modified Seldinger technique.  Selective  coronary angiography was performed using a JL4, JR4 Judkins catheter.  Multiple views were obtained.  All catheter exchanges were made over a  guidewire.  Single plane ventriculogram was performed in the RAO position  using a 6-French pigtail curved catheter.  There was no immediate  complications.  The patient was transferred to the holding area.  The  hemostasis sheath was removed.  Hemostasis was achieved using a FemStop  device.   FINDINGS:  Aortic pressure is 131/4.  EDP is 10.  Aortic pressure is 141/70.  There is no gradient noted.  Single plane ventriculogram revealed normal  wall motion, ejection fraction of 65%.  Fluoroscopy revealed appropriate  functioning of the patient's mitral valve prosthesis.  There was no mitral  regurgitation noted.   CORONARY ANGIOGRAPHY:  The left main coronary artery bifurcates into the  left anterior descending and circumflex vessel.  There was no significant  disease noted in the left main coronary artery.   Left anterior descending is a small to  moderate vessel giving rise to a  small D1, a small D2.  Goes on to end as a small tapering apical branch  which divides.  The distal LAD is tortuous but there is no identifiable  disease.   Circumflex vessel is also moderate in size.  Gives rise to a small OM1, a  small distal bifurcating OM2.  Ends as an AV groove vessel.  There is no  disease noted in the circumflex or its branches.   The right coronary artery is a large artery.  Gives rise to two RV  marginals, a PDA, and PL.  There is no disease noted in the right coronary  artery.  The right coronary artery provides most of the lateral circulation.   FINAL IMPRESSION:  1.  Normal coronary angiography.  No significant disease is noted.  The      patient is noted to have a small distal left anterior descending.  2.  Normal wall motion.  Ejection fraction 65%.  3.  Appropriate prosthetic mitral valve function.      [place]  HP/MEDQ  D:  02/26/2004  T:  02/26/2004  Job:  [id number]

## 2010-09-19 NOTE — H&P (Signed)
NAMENETTYE, FLEGAL              ACCOUNT NO.:  [id number]   MEDICAL RECORD NO.:  [id number]          PATIENT TYPE:  INP   LOCATION:                               FACILITY:  [facility]   PHYSICIAN:  Georgann Housekeeper, MD      DATE OF BIRTH:  1946/01/11   DATE OF ADMISSION:  DATE OF DISCHARGE:                                HISTORY & PHYSICAL   CHIEF COMPLAINT:  I am dizzy, nauseated, coughing, and my stomach hurts.   HISTORY OF PRESENT ILLNESS:  Seen in the ER on Sunday with a UTI, put on  three days of Macrobid.  She was coughing as well.  She was seen again in  the emergency room on Wednesday.  A CT scan of the abdomen showed a  hematoma.  Her INR was 5.7.  Her Coumadin was held.  She ate at [facility] on  Tuesday and ate a tuna fish sandwich with a lot of mayonnaise on it.  She  had increased nausea since then and developed diarrhea.  She estimates 10-20  stools a day, thought no stools today, no blood or mucus.  She was seen  today for failure to improve.  Decision was to admit her due to worsening  abdominal pain and dehydration.   PAST MEDICAL HISTORY:  1.  Mitral valve replacement secondary to mitral stenosis in 2001.      1.  She is on Coumadin secondary to mitral valve replacement.  2.  Transesophageal echo, in 2003, showed no vegetation.  3.  Clean coronary arteries on catheterization, October 2005.  4.  She has moderate pulmonary hypertension found on the TEE in 2003, and it      was estimated to be 4- to -6-mmHg.  5.  Hypothyroidism.  6.  Anxiety and depression.  7.  Syncope.  In 2003 she was hospitalized with no specific etiology found.   DRUG ALLERGIES:  1.  SUDAFED.  2.  THORAZINE.   CURRENT MEDICATIONS:  1.  Coumadin 2.5 mg a day.  2.  Ativan 0.5 mg b.i.d.  3.  Celexa 40 mg every day.  4.  Levoxyl 75 mcg every day.   PHYSICAL EXAMINATION:  GENERAL APPEARANCE:  A pale female in acute distress.  VITAL SIGNS:  Height is 5 foot 9 inches, weight is 140 pounds, temperature  is 98.0, pulse 72, blood pressure 100/60.  NEUROLOGIC:  Reveals cranial nerves II-XII intact.  Good reflexes, sensation  and motor function.  Alert and oriented x 3.  Moves all extremities.  HEENT:  Lips and oral mucosa are dry.  Head normocephalic.  Sclerae not  injected.  NECK:  Supple with no adenopathy.  LUNGS:  Clear.  CARDIAC:  S1 and S2 with no JVD or bruits.  ABDOMEN:  Soft.  Tender generally.  Slightly distended.  Bowel sounds  present all four quadrants.  No hepatosplenomegaly palpated.  No masses or  bruit.  EXTREMITIES:  No edema, cyanosis, or clubbing.   ASSESSMENT:  1.  Abdominal pain.  2.  Hematoma from vigorous cough and high INR.  3.  Dehydration from probable  viral gastroenteritis.   PLAN:  Admit for IV hydration and further evaluation of hematoma.  Plan  accordingly.      MAP/MEDQ  D:  07/04/2004  T:  07/04/2004  Job:  [id number]

## 2010-09-19 NOTE — Discharge Summary (Signed)
[place]  Patient:    Jasmine Kelley, Jasmine Kelley Visit Number: [id number] MRN: [id number]          Service Type: MED Location: [phone] Attending Physician:  Georgann Housekeeper Dictated by:   Tyson Dense, M.D. Admit Date:  07/05/2001 Discharge Date: 07/09/2001                             Discharge Summary  DATE OF BIRTH: 14-Feb-1946.  DISCHARGE DIAGNOSES: 1. Fever. 2. Dehydration. 3. Rule out subacute bacterial endocarditis. 4. Depression.  CONDITION ON DISCHARGE:  Stable.  DISCHARGE MEDICATIONS: 1. Coumadin as per her dose 5 mg alternating with 2.5 mg. 2. Synthroid 50 mcg q.d. 3. Lexapro 10 mg q.d. 4. Claritin 10 mg q.h.s. 5. Ativan 0.5 mg p.r.n.  DIET:  Regular.  ACTIVITY:  As tolerated.  HISTORY OF PRESENT ILLNESS:  Please see H&P for details.  The patient is 64 years old.  Admitted with febrile illness and also had passed out at home.  On the date of admission, on the initial evaluation she was found to be febrile to 102 and also had mild dehydration and mild tachycardia.  HOSPITAL COURSE: #1 - POSSIBLE SYNCOPAL EPISODE:  She had a CT scan of her head in the emergency room and it was negative for any bleed.  Her EKG showed normal sinus rhythm with mild tachycardia secondary to dehydration.  Her cardiac enzymes remained negative.  #2 - FEVER:  She had a fever up to 101 to 102 with normal white count and normal chemistries.  Her chest x-ray initial evaluation included a normal chest x-ray.  UA was negative.  She had blood cultures.  She had some upper respiratory symptoms preceding to this.  She was started on Tequin for a possible bronchitis.  She also had some sinus congestion.  She was continued on IV fluids with rehydration.  Her fevers remained at 101 to 102.  Blood pressure remained negative.  She underwent a transesophageal echocardiogram for her artificial valve to rule out SBE which was negative. Subsequently, she  underwent a TEE which also was negative for any vegetation. Because of a question of continuous fevers, she had negative blood cultures. Also in the emergency room, strep throat was negative.  Also mono test was negative.  She had a CT scan of her sinuses which was unremarkable.  Her white count initially on the day of hospitalization dropped down to 2.1 which subsequently rebounded back and she also had a sedimentation rate which was normal.  This was thought to be fever secondary to viral illness.  Her Tequin was stopped. She was also given a dose of vancomycin prior to her TEE.  After her TEE was done and negative and blood culture remained negative, it was discontinued.  #3 - DEPRESSION:  She was started on Lexapro 10 mg q.d. and continued on that along with her Ativan.  As far as mitral valve replacement, she continued on Coumadin with therapeutic dose throughout the hospital course.  She had some nausea during the hospital course controlled with the Phenergan and then Zofran.  FOLLOW-UP:  She will follow up in two to three weeks with me in the office. Dictated by:   Tyson Dense, M.D. Attending Physician:  Georgann Housekeeper DD:  07/11/01 TD:  07/12/01 Job: [id number]
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
## 2010-09-19 NOTE — Op Note (Signed)
[place]  Patient:    Jasmine Kelley, Jasmine Kelley                       MRN: [id number] Proc. Date: 07/29/99 Attending:  Gwenith Daily. Tyrone Sage, M.D. CC:         Danice Goltz, M.D.             John R. Aleen Campi, M.D.                           Operative Report  PREOPERATIVE DIAGNOSIS:  Mitral stenosis.  POSTOPERATIVE DIAGNOSIS:  Mitral stenosis.  SURGICAL PROCEDURE:  Mitral valve replacement with #31 St. Jude mechanical mitral valve prosthesis.  SURGEON:  Gwenith Daily. Tyrone Sage, M.D.  FIRST ASSISTANT:  Lissa Merlin, P.A.  BRIEF HISTORY:  The patient is a 65 year old female who has had known mitral stenosis for several years.  Earlier this year, she presented to the emergency oom with increasing shortness of breath.  A chest x-ray and CT scan were obtained.  There were vague findings of some enlarged nodes, and she was referred, because of her shortness of breath, to Dr. Jayme Cloud.  After her evaluation, the patient was referred back to cardiology because it was felt that her mitral stenosis was causing increasing symptoms.   The patient does have increasing dyspnea on exertion, three pillow orthopnea and increasing fatigue with very minimal activity, which has been progressive over the past several months.  Repeat cardiac catheterization confirmed the presence of mitral stenosis with mild pulmonary hypertension.  Mitral valve replacement was recommended to the patient, who agreed and signed informed consent. The patient was agreeable with the mechanical valve and known need to take Coumadin.  DESCRIPTION OF PROCEDURE:  With Swann-Ganz arterial line monitors in place, the  patient underwent general endotracheal anesthesia without incidence.  The chest and neck were prepped with Betadine and draped in the usual sterile manner.  A transesophageal echo probe was placed by Dr. Ivin Booty.  The transesophageal echocardiogram confirmed the diagnosis of mitral  stenosis.  There was trivial mitral regurgitation.  The mitral valve was severely limited with portal shortening and thickening of the leaflets.  A median sternotomy was performed. The pericardium was opened.  The patient had some mild enlargement of the right heart.  She was  systemically heparinized.  The ascending aorta and the right atrium were cannulated. Superior and inferior vena caval cannulas were used.  A retrograde cardioplegia catheter was placed.  The patient was placed on cardiopulmonary bypass. 2.4 L per minute per sq m.  The patients body temperature was cooled to 28 degrees.  An aortic cross clamp was applied and 500 cc of cold blood test cardioplegia was administered with rapid diastolic arrest of the heart. Myocardial septal temperature was monitored throughout the procedure.  The left atrium was  opened in the intra-atrial groove and, with proper retraction, good visualization of the mitral valve was obtained.  The valve was fixed with a small circumferential opening with major thickening of the leaflets and shortening of the cords. Because of this shortening of the cords, cordal attachments to the posterior papillary muscle were not preserved, some to the anterior and posterior leaflets were excised.  the valve was sized for a #31 St. Jude valve prosthesis, #2 Tycron pledgetted sutures were placed in a circumferential manner with the  pledgets on the atrial surface.  The valve was then seated in place and sutured  tied with the valve opening in anti anatomic position.  With the valve seated and secured in place, there was free movement of the leaflets.  The patient body temperature was rewarmed.  Small Foley catheter was placed across the central lumen as the atrium was closed to allow further deairing of the ventricle.  Prior to complete closure, the heat was put in the down position and the heart was allowed to passively fill and deair.  The small Foley  catheter was removed and the left atrial closure wsa completed with a running 3-0 Prolene horizontal mattress suture.  The patient was rewarmed to 37 degrees.  A 15 gauge needle was introduced into the left ventricular apex to further deair the heart.  The patient required electrodefibrillation and returned to a sinus rhythm. The patient was then ventilated.  On transesophageal echocardiogram, there appeared to be good function of the valve with two very small jets of regurgitation compatible with the lateral orifices.  The patient remained hemodynamically stable.  She was separated from cardiopulmonary bypass and decannulated in the usual fashion.  Protamine sulfate was administered with the  operative field hemostatic.  Two atrial and ventricular pacing wires were applied. The pericardium was reapproximated.  The sternum was closed with #16 steel wires. The fascia was closed with interrupted 0 Vicryl, running 3-0 Vicryl in the subcutaneous tissue, 4-0 subcuticular stitching on the skin edges.  Dry dressings were applied.  Sponge and needle counts were correct at the completion of the procedure.  The patient tolerated the procedure without obvious complication and was transferred to the surgical intensive care unit for further postoperative care. DD:  07/29/99 TD:  07/30/99 Job: [id number]

## 2010-09-19 NOTE — Consult Note (Signed)
. [facility]  Patient:    Jasmine Kelley, Jasmine Kelley                       MRN: [id number] Proc. Date: 07/23/99 Adm. Date:  [id number] Attending:  Silvestre Mesi CC:         Gwenith Daily Tyrone Sage, M.D.                          Consultation Report  REASON FOR CONSULTATION:  Worsening dyspnea on exertion and congestive heart failure.  HISTORY OF PRESENT ILLNESS:  Patient is a 65 year old white female who has a known history of mitral valve stenosis that was at least moderate by cardiac catheterization in February 2000.  At that time she had had shortness of breath but over the past three months had noted marked increased shortness of breath with three pillow orthopnea, hand edema, marked dyspnea with exertion to the point where very minimal exertion was causing shortness of breath.  Patient has also noted increasing fatigue required to leave work because of exhaustion.  She has had some episodes of faintness, but no syncope.  She denies any recent palpitations, but has noted them in the past.  Because of the increase in symptoms she came to the emergency room February 27 and was referred to Dr. Jayme Cloud for a pulmonary consult and then referred back to Dr. Aleen Campi for further evaluation of her mitral valve. Cardiac catheterization was performed today by Dr. Aleen Campi which demonstrated significant mitral stenosis with a mitral valve area of 1.3, mitral valve gradient of 10.4.  There was no aortic valve gradient.  PA pressures were 36/18 with amine of 27, pulmonary capillary wedge pressure of 20, cardiac index was 2.5.  PAST MEDICAL HISTORY: 1. Palpitations. 2. History of anxiety and depression disorder. 3. As noted, dyspnea on exertion. 4. Pulmonary hypertension. 5. History of mitral stenosis, though the patient denies any definite history of    rheumatic fever.  She was noted to have left atrial enlargement on echo, but has    not had any  documented atrial fibrillation.  PAST SURGICAL HISTORY: 1. Appendectomy. 2. Right eye surgery.  ALLERGIES:  THORAZINE which caused severe dystonia.  MEDICATIONS: 1. Two aspirin every morning. 2. Paxil 40 mg a day. 3. Xanax 0.5 q.h.s. 4. Altace 5 mg q.d.  FAMILY HISTORY:  Patients father died at age 68 of CDA.  He had history of myocardial infarction and coronary artery bypass grafting.  Patients mother died at age 63 of breast cancer.  She noted two siblings that had enlarged hearts but there is no definite family history of rheumatic fever.  SOCIAL HISTORY:  Patient is married.  Works.  Denies any tobacco use. Occasional alcohol use.  Denies any drug abuse.  Is currently employed in a [employer].  REVIEW OF SYSTEMS:  Patient denies any constitutional symptoms such as fever. he does note occasional sweats.  Weight has been stable.  She has had some diplopia. Wears glasses.  Has noted some dyspnea on exertion in the past.  Has some mild cough without sputum production.  She has an occasional nausea.  She denies any  change in bowel habits.  Denies any blood in her urine or stool.  She is postmenopausal.  MUSCULOSKELETAL:  Notes some swelling of her hands recently. he denies any skin problems.  There is a question of goiter diagnosed by Dr. Jayme Cloud. Patient denies any bleeding problems.  She also notes allergies to bee stings.  PHYSICAL EXAMINATION:  GENERAL:  Patient appears her stated age.  VITAL SIGNS:  Blood pressure 110/70.  She is in a sinus rhythm at 72.  Room air O2 saturation is 96%.  HEENT:  Pupils are equal, round and reactive to light.  NECK:  Without bruits.  There is no jugular venous distention.  Do not appreciate any definite goiter, though patient noted Dr. Jayme Cloud thought possibly she had a goiter.  LUNGS:  Clear bilaterally.  HEART:  She has an early diastolic murmur heard over the apex of the heart. There is no murmur appreciated  along the right sternal border.  ABDOMEN:  Benign without palpable masses or enlargement of the aorta.  EXTREMITIES:  With palpable DP and PT pulses.  There is no pedal edema.  She does have slight swelling in both hands and fingers.  LABORATORIES:  Cardiac catheterization films were reviewed both from today and rom one year ago.  She has normal coronary arteries without obstruction.  Cardiac hemodynamics are as noted above.  DISCUSSION:  Because of the patients dramatic increase in symptoms over the past several months with known mitral stenosis, I agree with consideration for mitral valve replacement.  I have discussed this with the patient and her family in detail including the risks of the surgery including death, infection, stroke, myocardial infarction, bleeding, and blood transfusion.  In addition have specifically discussed choices of valve options and possible need for lifelong Coumadin. Her questions have been answered and she is willing to take Coumadin and understands the risks at the time and is willing to accept the mechanical valve.  Because of her poor dentition and not having had any dental work for several years, will request a dental consult and obtain an orthopanogram prior to scheduling mitral  valve surgery in case there is any dental work that needs to be carried out. DD:  07/23/99 TD:  07/23/99 Job: [id number]

## 2010-09-19 NOTE — Procedures (Signed)
[place]  Patient:    Jasmine Kelley, NOY Visit Number: [id number] MRN: [id number]          Service Type: MED Location: [phone] Attending Physician:  Georgann Housekeeper Dictated by:   Francisca December, M.D. Proc. Date: 07/07/01 Admit Date:  07/05/2001   CC:         Tyson Dense, M.D.  Echocardiography Laboratory   Procedure Report  PROCEDURE PERFORMED:  Transesophageal echocardiogram.  SURGEON:  Francisca December, M.D.  INDICATIONS:  The patient is a 65 year old woman, two years status post mitral valve replacement, and St. Jude prosthesis.  She is admitted now with fever of unknown etiology.  This study is undertaken to evaluate for possible endocarditis.  Transthoracic echocardiogram was unremarkable.  DESCRIPTION OF PROCEDURE:  The transesophageal probe placed atraumatically in the endoscopy suite.  This was facilitated by posterior pharyngeal anesthesia using 20% benzocaine.  She also received a total of 5 mg of Versed and 75 mcg of fentanyl during the procedure.  Heart rate, blood pressure, O2 saturation, and ECG were monitored throughout and remained stable.  FINDINGS:  Left ventricle is of normal size and contracting adequately. Estimated ejection fraction 60-65%.  No regional wall motion abnormalities. The right ventricle is normal size and contracting well also.  Tricuspid valve was morphologically normal.  There is mild to moderate tricuspid regurgitation measured at 3 meters per second, indicating an R-V and PA systolic pressure of 46 mmHg.  This represents moderate pulmonary hypertension.  The aortic valve is trileaflet, has normal morphology, and opens adequately.  The mitral valve is prosthetic St. Jude device, double tilting discs.  Pilot motion is full and complete.  There is stable sewing ring.  No evidence of any disruption.  No apparent abscess foci.  The left atrium is enlarged.  Left atrium is well-visualized.   There is no thrombus.  The left upper pulmonary vein is well-visualized with normal flow.  There is trivial to mild mitral regurgitation as would be expected for this valve.  The intra-atrial septum is intact.  No evidence of shunting.  FINAL IMPRESSION: 1. Normal left ventricular size and systolic function. 2. Prosthetic St. Jude-type mitral valve in place, stable. 3. No evidence to support the possibility of valvular endocarditis. Dictated by:   Francisca December, M.D. Attending Physician:  Georgann Housekeeper DD:  07/07/01 TD:  07/09/01 Job: [id number]

## 2010-09-19 NOTE — H&P (Signed)
. [facility]  Patient:    Jasmine Kelley, Jasmine Kelley                       MRN: [id number] Adm. Date:  07/23/99 Attending:  Aram Candela. Aleen Campi, M.D. Dictator:   Donzetta Matters, P.A.                         History and Physical  DATE OF BIRTH:  Dec 07, 1945  CHIEF COMPLAINT:  Shortness of breath.  HISTORY OF PRESENT ILLNESS:  This 65 year old female with known mitral valve stenosis was evaluated last year by heart catheterization on June 26, 1999.  She had a repeat 2D echocardiogram on June 16, 1999 as follow-up which did continue to show some moderate mitral valve stenosis.  She began to have shortness of breath approximately June 30, 1999.  She was seen at the hospital as well as had follow-up with Dr. Jayme Cloud for pulmonary consult.  She was told that her mitral valve was the probable cause of her shortness of breath.  She was then scheduled today for heart catheterization.  She has had some symptoms of palpitations, anxiety, and depression, dyspnea on exertion, pulmonary hypertension, rheumatic valve disease with stenosis.  OTHER PAST MEDICAL HISTORY:  Appendectomy, right eye surgery for crossed eyes, cardiac catheterization, and echo did show some left atrial enlargement.  ALLERGIES:  ___________ causes dystonia as well as severe reaction per the patient.  CURRENT MEDICATIONS:  Coated aspirin two tablets in the a.m., Paxil 40 mg daily, Xanax 0.5 mg at bedtime, and Altace 5 mg q.d.  FAMILY HISTORY:  Father died at age 65 of CVA, heart disease, as well as diabetes. He did have a CABG before he had an MI.  Mother died at age 42 of breast cancer. One sister with gallbladder removed, one sister with degenerative joint disease and cardiomegaly.  SOCIAL HISTORY:  Is married and works.  No tobacco use.  Drinks decaf coffee and does drink some alcohol.  She is right-handed and denies any drug abuse.  REVIEW OF SYSTEMS:  Has had some  chills and sweating.  Weight is stable.  Edema of the hands.  Sleeps poorly.  Does have some double vision as well as blurring. oes wear glasses.  Does not hear well.  States she has ringing in her left ear. Denies any chest pain.  Does have some heart fluttering.  Dyspnea on exertion as well s PND.  No claudication.  Does cough.  Describes no wheezing.  Again denies any smoking.  She states she does have some problems with nausea three times a week. Has had some diarrhea.  Does get up one time per night for urination.  Is postmenopausal.  Does have painful joints in her elbows and shoulders, myalgias in her arms, and has some fatigue as well as unsteady gait.  Denies any rashes. Denies any breast discharge.  Did have an episode of fainting one month ago. Some dizziness.  Numbness to her arms.  States she has some depression with tension s well as stress.  Does have some difficultly with appetite with decreased appetite. Was told by Dr. Jayme Cloud that she had an enlarged thyroid but denies any excessive thirst or excessive hunger.  Has some easy bruising but otherwise no other specific problems.  NON-DRUG ALLERGIES:  Bee stings which causes severe swelling.  PHYSICAL EXAMINATION:  VITAL SIGNS:  Temperature is afebrile, pulse 60, respirations 16, and  blood pressure 104/64.  Weight is 150 pounds.  GENERAL:  Well-developed, well-nourished, white female in no acute distress.  HEENT:  Pupils equal.  Extraocular movements are intact.  Mouth and pharynx is benign.  She is normocephalic and atraumatic.  NECK:  Supple.  Has a midline trachea.  Does have a palpable thyroid.  Denies any JVDs.  No bruits.  CHEST:  Clear to auscultation.  BREAST:  Normal female.  No cervical adenopathy.  HEART:  Regular rate and rhythm.  Does have a soft diastolic murmur noted.  ABDOMEN:  Soft and nontender.  No abdominal bruits.  She is nontender over the bladder, post menopausal.  EXTREMITIES:   Moves both upper and lower extremities without any difficulty. Strength is 5/5.  No ankle edema.  SKIN:  Warm and dry.  There is no jaundice, cyanosis, pallor, or rashes. Normal capillary refill.  NEUROLOGIC:  Cranial nerves II-XII appear grossly intact.  She is oriented to person, place, time, and situation.  Speech is clear.  There is no obvious tremor.  DIAGNOSTIC STUDIES:  Chest x-ray on July 01, 1999 showed diffuse edema to the lungs.  EKG on July 01, 1999 was stable with some left atrial enlargement.  IMPRESSION: 1. Mitral valve stenosis. 2. Pulmonary hypertension. 3. Palpitations. 4. Dyspnea on exertion.  PLAN:  She is to have follow-up labs and schedule for heart catheterization right and left to evaluate mitral valves. DD:  07/23/99 TD:  07/23/99 Job: [id number]

## 2010-09-19 NOTE — Cardiovascular Report (Signed)
[facility]  Patient:    Jasmine Kelley, Jasmine Kelley                       MRN: [id number] Proc. Date: 07/23/99 Adm. Date:  [id number] Attending:  Silvestre Mesi CC:         Cardiac Catheterization Laboratory                        Cardiac Catheterization  PROCEDURE: 1. Right heart catheterization. 2. Left heart catheterization. 3. Coronary cine angiography. 4. Left ventricular cine angiography. 5. Aortic root cine angiography. 6. Perclose of the right femoral artery.  CARDIOLOGIST:  Aram Candela. Aleen Campi, M.D.  INDICATIONS:  This 65 year old female has a known history of mitral valve disease with moderate mitral stenosis noted on prior cardiac evaluation.  She had moderate stenosis approximately one year ago on a cardiac catheterization, and has since  then had a marked increase in symptomatology, with marked fatigue, dyspnea on exertion, and palpitations.  She now is scheduled for a repeat cardiac catheterization because of the high probability of significant progression of stenosis of her mitral valve.  She was also noted to have mild pulmonary hypertension.  DESCRIPTION OF PROCEDURE:  After signing an informed consent, the patient was premedicated with 50 mg of Benadryl intravenously and brought to the cardiac catheterization laboratory at [facility].  Her right groin was prepped and draped in a sterile fashion and anesthetized locally with 1% lidocaine. A 6-French introducer sheath was inserted percutaneously into the right femoral  artery.  An 8-French introducer sheath was inserted percutaneously into the right femoral vein.  A 7-French Swan-Ganz catheter was inserted through the right vein sheath and advanced to the right atrium, right ventricle, pulmonary artery, in wedge positions.  Pressures were recorded and cardiac output was measured using  thermodilution technique.  Next, a 6-French pigtail catheter was  inserted through the right femoral artery sheath and advanced to the root of the aorta and left ventricle.  Simultaneous oximetry samples were taken from the left ventricle and pulmonary artery.  Injections were made into the left ventricle.  A pullback across the aortic valve was recorded.  Injection was made into the root of the aorta. The 6-French #4 Judkins coronary catheters were used to make injections into the coronary arteries.  The patient tolerated the procedure well, and no complications were noted.  At he end of the procedure the catheters and sheaths were removed from the right femoral vessels, and hemostasis was easily obtained with a Perclose closure system in the right femoral artery, and standard pressure in the right femoral vein.  The patient was then admitted to 6500 for overnight monitoring and a consultation from CVTS, for possible repair of her mitral valve stenosis.  MEDICATIONS GIVEN:  None.  HEMODYNAMIC DATA: Right atrial pressure:  A-wave of 7, V-wave of 4, with a mean of 5. Right ventricular pressure:  43/0-7. Pulmonary artery pressure:  36/18, with a mean of 27. Pulmonary capillary wedge pressure:  A-wave of 25, V-wave of 32, mean of 22. Left ventricular pressure:  120/0-11. Aortic pressure:  119/71, with a mean of 91. Mitral valve gradient:  10.4 mmHg. Mitral valve area:  1.30 sq cm. AVO2 difference:  4.0. Cardiac output:  4.7 L per minute. Cardiac index:  2.58. DD:  07/23/99 TD:  07/23/99 Job: [id number]

## 2011-05-18 DIAGNOSIS — Z954 Presence of other heart-valve replacement: Secondary | ICD-10-CM | POA: Diagnosis not present

## 2011-05-18 DIAGNOSIS — Z7901 Long term (current) use of anticoagulants: Secondary | ICD-10-CM | POA: Diagnosis not present

## 2011-06-08 DIAGNOSIS — Z954 Presence of other heart-valve replacement: Secondary | ICD-10-CM | POA: Diagnosis not present

## 2011-06-08 DIAGNOSIS — Z7901 Long term (current) use of anticoagulants: Secondary | ICD-10-CM | POA: Diagnosis not present

## 2011-07-06 DIAGNOSIS — Z7901 Long term (current) use of anticoagulants: Secondary | ICD-10-CM | POA: Diagnosis not present

## 2011-07-06 DIAGNOSIS — Z954 Presence of other heart-valve replacement: Secondary | ICD-10-CM | POA: Diagnosis not present

## 2011-07-20 DIAGNOSIS — I4891 Unspecified atrial fibrillation: Secondary | ICD-10-CM | POA: Diagnosis not present

## 2011-07-20 DIAGNOSIS — Z7901 Long term (current) use of anticoagulants: Secondary | ICD-10-CM | POA: Diagnosis not present

## 2011-07-23 ENCOUNTER — Other Ambulatory Visit: Payer: Self-pay | Admitting: Internal Medicine

## 2011-07-23 DIAGNOSIS — I059 Rheumatic mitral valve disease, unspecified: Secondary | ICD-10-CM | POA: Diagnosis not present

## 2011-07-23 DIAGNOSIS — Z0000 Encounter for general adult medical examination without abnormal findings: Secondary | ICD-10-CM | POA: Diagnosis not present

## 2011-07-23 DIAGNOSIS — Z1231 Encounter for screening mammogram for malignant neoplasm of breast: Secondary | ICD-10-CM

## 2011-07-23 DIAGNOSIS — F329 Major depressive disorder, single episode, unspecified: Secondary | ICD-10-CM | POA: Diagnosis not present

## 2011-07-23 DIAGNOSIS — E039 Hypothyroidism, unspecified: Secondary | ICD-10-CM | POA: Diagnosis not present

## 2011-07-23 DIAGNOSIS — I4891 Unspecified atrial fibrillation: Secondary | ICD-10-CM | POA: Diagnosis not present

## 2011-08-12 ENCOUNTER — Ambulatory Visit
Admission: RE | Admit: 2011-08-12 | Discharge: 2011-08-12 | Disposition: A | Discharge status: Home / Self Care | Payer: Medicare Other | Source: Ambulatory Visit | Attending: Internal Medicine | Admitting: Internal Medicine

## 2011-08-12 DIAGNOSIS — Z1231 Encounter for screening mammogram for malignant neoplasm of breast: Secondary | ICD-10-CM | POA: Diagnosis not present

## 2011-08-31 DIAGNOSIS — Z7901 Long term (current) use of anticoagulants: Secondary | ICD-10-CM | POA: Diagnosis not present

## 2011-08-31 DIAGNOSIS — Z79899 Other long term (current) drug therapy: Secondary | ICD-10-CM | POA: Diagnosis not present

## 2011-08-31 DIAGNOSIS — Z954 Presence of other heart-valve replacement: Secondary | ICD-10-CM | POA: Diagnosis not present

## 2011-09-02 ENCOUNTER — Other Ambulatory Visit: Payer: Self-pay | Admitting: Obstetrics and Gynecology

## 2011-09-02 ENCOUNTER — Other Ambulatory Visit (HOSPITAL_COMMUNITY)
Admission: RE | Admit: 2011-09-02 | Discharge: 2011-09-02 | Disposition: A | Discharge status: Home / Self Care | Payer: Medicare Other | Source: Ambulatory Visit | Attending: Obstetrics and Gynecology | Admitting: Obstetrics and Gynecology

## 2011-09-02 DIAGNOSIS — Z1159 Encounter for screening for other viral diseases: Secondary | ICD-10-CM | POA: Insufficient documentation

## 2011-09-02 DIAGNOSIS — L293 Anogenital pruritus, unspecified: Secondary | ICD-10-CM | POA: Diagnosis not present

## 2011-09-02 DIAGNOSIS — Z124 Encounter for screening for malignant neoplasm of cervix: Secondary | ICD-10-CM | POA: Diagnosis not present

## 2011-09-02 DIAGNOSIS — Z01419 Encounter for gynecological examination (general) (routine) without abnormal findings: Secondary | ICD-10-CM | POA: Diagnosis not present

## 2011-10-02 ENCOUNTER — Other Ambulatory Visit: Payer: Self-pay | Admitting: Gastroenterology

## 2011-10-02 DIAGNOSIS — D126 Benign neoplasm of colon, unspecified: Secondary | ICD-10-CM | POA: Diagnosis not present

## 2011-10-02 DIAGNOSIS — Z1211 Encounter for screening for malignant neoplasm of colon: Secondary | ICD-10-CM | POA: Diagnosis not present

## 2011-10-02 DIAGNOSIS — K644 Residual hemorrhoidal skin tags: Secondary | ICD-10-CM | POA: Diagnosis not present

## 2011-10-14 DIAGNOSIS — Z7901 Long term (current) use of anticoagulants: Secondary | ICD-10-CM | POA: Diagnosis not present

## 2011-10-14 DIAGNOSIS — Z954 Presence of other heart-valve replacement: Secondary | ICD-10-CM | POA: Diagnosis not present

## 2011-11-18 DIAGNOSIS — Z7901 Long term (current) use of anticoagulants: Secondary | ICD-10-CM | POA: Diagnosis not present

## 2011-11-18 DIAGNOSIS — Z954 Presence of other heart-valve replacement: Secondary | ICD-10-CM | POA: Diagnosis not present

## 2011-11-18 DIAGNOSIS — T148 Other injury of unspecified body region: Secondary | ICD-10-CM | POA: Diagnosis not present

## 2011-11-18 DIAGNOSIS — W57XXXA Bitten or stung by nonvenomous insect and other nonvenomous arthropods, initial encounter: Secondary | ICD-10-CM | POA: Diagnosis not present

## 2011-12-30 DIAGNOSIS — I6789 Other cerebrovascular disease: Secondary | ICD-10-CM | POA: Diagnosis not present

## 2011-12-30 DIAGNOSIS — I059 Rheumatic mitral valve disease, unspecified: Secondary | ICD-10-CM | POA: Diagnosis not present

## 2011-12-30 DIAGNOSIS — Z7901 Long term (current) use of anticoagulants: Secondary | ICD-10-CM | POA: Diagnosis not present

## 2011-12-30 DIAGNOSIS — F411 Generalized anxiety disorder: Secondary | ICD-10-CM | POA: Diagnosis not present

## 2011-12-30 DIAGNOSIS — E039 Hypothyroidism, unspecified: Secondary | ICD-10-CM | POA: Diagnosis not present

## 2011-12-30 DIAGNOSIS — Z0000 Encounter for general adult medical examination without abnormal findings: Secondary | ICD-10-CM | POA: Diagnosis not present

## 2011-12-30 DIAGNOSIS — F329 Major depressive disorder, single episode, unspecified: Secondary | ICD-10-CM | POA: Diagnosis not present

## 2011-12-30 DIAGNOSIS — L989 Disorder of the skin and subcutaneous tissue, unspecified: Secondary | ICD-10-CM | POA: Diagnosis not present

## 2011-12-30 DIAGNOSIS — Z1331 Encounter for screening for depression: Secondary | ICD-10-CM | POA: Diagnosis not present

## 2012-01-05 DIAGNOSIS — Z954 Presence of other heart-valve replacement: Secondary | ICD-10-CM | POA: Diagnosis not present

## 2012-01-05 DIAGNOSIS — Z7901 Long term (current) use of anticoagulants: Secondary | ICD-10-CM | POA: Diagnosis not present

## 2012-01-06 DIAGNOSIS — L738 Other specified follicular disorders: Secondary | ICD-10-CM | POA: Diagnosis not present

## 2012-01-06 DIAGNOSIS — L678 Other hair color and hair shaft abnormalities: Secondary | ICD-10-CM | POA: Diagnosis not present

## 2012-01-27 DIAGNOSIS — N951 Menopausal and female climacteric states: Secondary | ICD-10-CM | POA: Diagnosis not present

## 2012-01-27 DIAGNOSIS — M25549 Pain in joints of unspecified hand: Secondary | ICD-10-CM | POA: Diagnosis not present

## 2012-02-10 DIAGNOSIS — B373 Candidiasis of vulva and vagina: Secondary | ICD-10-CM | POA: Diagnosis not present

## 2012-02-10 DIAGNOSIS — Z7901 Long term (current) use of anticoagulants: Secondary | ICD-10-CM | POA: Diagnosis not present

## 2012-02-10 DIAGNOSIS — Z954 Presence of other heart-valve replacement: Secondary | ICD-10-CM | POA: Diagnosis not present

## 2012-02-17 DIAGNOSIS — Z954 Presence of other heart-valve replacement: Secondary | ICD-10-CM | POA: Diagnosis not present

## 2012-02-17 DIAGNOSIS — Z7901 Long term (current) use of anticoagulants: Secondary | ICD-10-CM | POA: Diagnosis not present

## 2012-02-19 DIAGNOSIS — Z954 Presence of other heart-valve replacement: Secondary | ICD-10-CM | POA: Diagnosis not present

## 2012-02-19 DIAGNOSIS — Z7901 Long term (current) use of anticoagulants: Secondary | ICD-10-CM | POA: Diagnosis not present

## 2012-02-19 DIAGNOSIS — M810 Age-related osteoporosis without current pathological fracture: Secondary | ICD-10-CM | POA: Diagnosis not present

## 2012-02-19 DIAGNOSIS — Z23 Encounter for immunization: Secondary | ICD-10-CM | POA: Diagnosis not present

## 2012-02-25 DIAGNOSIS — Z7901 Long term (current) use of anticoagulants: Secondary | ICD-10-CM | POA: Diagnosis not present

## 2012-02-25 DIAGNOSIS — Z954 Presence of other heart-valve replacement: Secondary | ICD-10-CM | POA: Diagnosis not present

## 2012-02-25 DIAGNOSIS — N390 Urinary tract infection, site not specified: Secondary | ICD-10-CM | POA: Diagnosis not present

## 2012-02-29 DIAGNOSIS — Z7901 Long term (current) use of anticoagulants: Secondary | ICD-10-CM | POA: Diagnosis not present

## 2012-02-29 DIAGNOSIS — Z954 Presence of other heart-valve replacement: Secondary | ICD-10-CM | POA: Diagnosis not present

## 2012-03-07 DIAGNOSIS — Z7901 Long term (current) use of anticoagulants: Secondary | ICD-10-CM | POA: Diagnosis not present

## 2012-03-07 DIAGNOSIS — Z954 Presence of other heart-valve replacement: Secondary | ICD-10-CM | POA: Diagnosis not present

## 2012-04-13 DIAGNOSIS — F329 Major depressive disorder, single episode, unspecified: Secondary | ICD-10-CM | POA: Diagnosis not present

## 2012-04-13 DIAGNOSIS — Z7901 Long term (current) use of anticoagulants: Secondary | ICD-10-CM | POA: Diagnosis not present

## 2012-04-13 DIAGNOSIS — E039 Hypothyroidism, unspecified: Secondary | ICD-10-CM | POA: Diagnosis not present

## 2012-04-13 DIAGNOSIS — M810 Age-related osteoporosis without current pathological fracture: Secondary | ICD-10-CM | POA: Diagnosis not present

## 2012-04-13 DIAGNOSIS — Z954 Presence of other heart-valve replacement: Secondary | ICD-10-CM | POA: Diagnosis not present

## 2012-06-13 DIAGNOSIS — Z954 Presence of other heart-valve replacement: Secondary | ICD-10-CM | POA: Diagnosis not present

## 2012-06-13 DIAGNOSIS — Z7901 Long term (current) use of anticoagulants: Secondary | ICD-10-CM | POA: Diagnosis not present

## 2012-06-20 DIAGNOSIS — Z7901 Long term (current) use of anticoagulants: Secondary | ICD-10-CM | POA: Diagnosis not present

## 2012-06-20 DIAGNOSIS — Z954 Presence of other heart-valve replacement: Secondary | ICD-10-CM | POA: Diagnosis not present

## 2012-07-29 DIAGNOSIS — I4891 Unspecified atrial fibrillation: Secondary | ICD-10-CM | POA: Diagnosis not present

## 2012-07-29 DIAGNOSIS — Z7901 Long term (current) use of anticoagulants: Secondary | ICD-10-CM | POA: Diagnosis not present

## 2012-08-04 DIAGNOSIS — I4891 Unspecified atrial fibrillation: Secondary | ICD-10-CM | POA: Diagnosis not present

## 2012-08-04 DIAGNOSIS — Z7901 Long term (current) use of anticoagulants: Secondary | ICD-10-CM | POA: Diagnosis not present

## 2012-08-04 DIAGNOSIS — E781 Pure hyperglyceridemia: Secondary | ICD-10-CM | POA: Diagnosis not present

## 2012-08-04 DIAGNOSIS — Z954 Presence of other heart-valve replacement: Secondary | ICD-10-CM | POA: Diagnosis not present

## 2012-10-11 DIAGNOSIS — Z954 Presence of other heart-valve replacement: Secondary | ICD-10-CM | POA: Diagnosis not present

## 2012-10-11 DIAGNOSIS — Z7901 Long term (current) use of anticoagulants: Secondary | ICD-10-CM | POA: Diagnosis not present

## 2012-10-19 DIAGNOSIS — Z7901 Long term (current) use of anticoagulants: Secondary | ICD-10-CM | POA: Diagnosis not present

## 2012-10-19 DIAGNOSIS — Z954 Presence of other heart-valve replacement: Secondary | ICD-10-CM | POA: Diagnosis not present

## 2012-12-26 DIAGNOSIS — Z7901 Long term (current) use of anticoagulants: Secondary | ICD-10-CM | POA: Diagnosis not present

## 2012-12-26 DIAGNOSIS — Z954 Presence of other heart-valve replacement: Secondary | ICD-10-CM | POA: Diagnosis not present

## 2013-01-18 DIAGNOSIS — Z7901 Long term (current) use of anticoagulants: Secondary | ICD-10-CM | POA: Diagnosis not present

## 2013-01-18 DIAGNOSIS — Z954 Presence of other heart-valve replacement: Secondary | ICD-10-CM | POA: Diagnosis not present

## 2013-03-20 ENCOUNTER — Ambulatory Visit (INDEPENDENT_AMBULATORY_CARE_PROVIDER_SITE_OTHER): Payer: Medicare Other | Admitting: Pharmacist

## 2013-03-20 DIAGNOSIS — Z954 Presence of other heart-valve replacement: Secondary | ICD-10-CM | POA: Diagnosis not present

## 2013-03-20 DIAGNOSIS — F329 Major depressive disorder, single episode, unspecified: Secondary | ICD-10-CM | POA: Diagnosis not present

## 2013-03-20 DIAGNOSIS — Z7901 Long term (current) use of anticoagulants: Secondary | ICD-10-CM | POA: Diagnosis not present

## 2013-03-20 DIAGNOSIS — Z1331 Encounter for screening for depression: Secondary | ICD-10-CM | POA: Diagnosis not present

## 2013-03-20 DIAGNOSIS — I059 Rheumatic mitral valve disease, unspecified: Secondary | ICD-10-CM

## 2013-03-20 DIAGNOSIS — E039 Hypothyroidism, unspecified: Secondary | ICD-10-CM | POA: Diagnosis not present

## 2013-03-20 DIAGNOSIS — Z23 Encounter for immunization: Secondary | ICD-10-CM | POA: Diagnosis not present

## 2013-03-20 DIAGNOSIS — I2789 Other specified pulmonary heart diseases: Secondary | ICD-10-CM | POA: Diagnosis not present

## 2013-03-20 DIAGNOSIS — M810 Age-related osteoporosis without current pathological fracture: Secondary | ICD-10-CM | POA: Diagnosis not present

## 2013-03-20 LAB — POCT INR: INR: 2.2

## 2013-04-26 ENCOUNTER — Other Ambulatory Visit: Payer: Self-pay | Admitting: Interventional Cardiology

## 2013-05-05 NOTE — Telephone Encounter (Signed)
Patient stated she has missed 2 doses of warfarin and is out. She is overdue and has a h/o non-compliance with follow up appointments.  I left a message to inform her I sent in a refill to [facility] for a 2 week supply, but she needs to come in for protime next week.  She is to take warfarin 4 mg today and tomorrow, then resume 2 mg qd except 4 mg W and recheck INR next week - she is to call back and make an appointment.  To Dr. Irish Lack as FYI only.

## 2013-05-18 ENCOUNTER — Ambulatory Visit (INDEPENDENT_AMBULATORY_CARE_PROVIDER_SITE_OTHER): Payer: Medicare Other | Admitting: Pharmacist

## 2013-05-18 DIAGNOSIS — I059 Rheumatic mitral valve disease, unspecified: Secondary | ICD-10-CM | POA: Diagnosis not present

## 2013-05-18 DIAGNOSIS — Z7901 Long term (current) use of anticoagulants: Secondary | ICD-10-CM

## 2013-05-18 DIAGNOSIS — Z954 Presence of other heart-valve replacement: Secondary | ICD-10-CM

## 2013-05-18 DIAGNOSIS — Z5181 Encounter for therapeutic drug level monitoring: Secondary | ICD-10-CM | POA: Diagnosis not present

## 2013-05-18 LAB — POCT INR: INR: 3.3

## 2013-05-18 MED ORDER — WARFARIN SODIUM 2 MG PO TABS: ORAL_TABLET | ORAL | Status: DC

## 2013-07-03 ENCOUNTER — Encounter: Payer: Self-pay | Admitting: *Deleted

## 2013-07-25 ENCOUNTER — Other Ambulatory Visit: Payer: Self-pay | Admitting: Internal Medicine

## 2013-07-25 DIAGNOSIS — I2789 Other specified pulmonary heart diseases: Secondary | ICD-10-CM | POA: Diagnosis not present

## 2013-07-25 DIAGNOSIS — I4891 Unspecified atrial fibrillation: Secondary | ICD-10-CM | POA: Diagnosis not present

## 2013-07-25 DIAGNOSIS — F329 Major depressive disorder, single episode, unspecified: Secondary | ICD-10-CM | POA: Diagnosis not present

## 2013-07-25 DIAGNOSIS — Z1231 Encounter for screening mammogram for malignant neoplasm of breast: Secondary | ICD-10-CM

## 2013-07-25 DIAGNOSIS — E782 Mixed hyperlipidemia: Secondary | ICD-10-CM | POA: Diagnosis not present

## 2013-07-25 DIAGNOSIS — E039 Hypothyroidism, unspecified: Secondary | ICD-10-CM | POA: Diagnosis not present

## 2013-07-25 DIAGNOSIS — K649 Unspecified hemorrhoids: Secondary | ICD-10-CM | POA: Diagnosis not present

## 2013-08-08 ENCOUNTER — Ambulatory Visit: Payer: Medicare Other | Admitting: Interventional Cardiology

## 2013-08-10 ENCOUNTER — Other Ambulatory Visit: Payer: Self-pay | Admitting: *Deleted

## 2013-08-10 MED ORDER — WARFARIN SODIUM 2 MG PO TABS: ORAL_TABLET | ORAL | Status: DC

## 2013-08-23 ENCOUNTER — Ambulatory Visit (INDEPENDENT_AMBULATORY_CARE_PROVIDER_SITE_OTHER): Payer: Medicare Other | Admitting: Pharmacist

## 2013-08-23 DIAGNOSIS — Z954 Presence of other heart-valve replacement: Secondary | ICD-10-CM | POA: Diagnosis not present

## 2013-08-23 DIAGNOSIS — I059 Rheumatic mitral valve disease, unspecified: Secondary | ICD-10-CM

## 2013-08-23 DIAGNOSIS — Z7901 Long term (current) use of anticoagulants: Secondary | ICD-10-CM

## 2013-08-23 LAB — POCT INR: INR: 5

## 2013-09-04 ENCOUNTER — Encounter: Payer: Self-pay | Admitting: Interventional Cardiology

## 2013-09-06 ENCOUNTER — Ambulatory Visit (INDEPENDENT_AMBULATORY_CARE_PROVIDER_SITE_OTHER): Payer: Medicare Other | Admitting: Pharmacist

## 2013-09-06 DIAGNOSIS — I059 Rheumatic mitral valve disease, unspecified: Secondary | ICD-10-CM | POA: Diagnosis not present

## 2013-09-06 DIAGNOSIS — Z954 Presence of other heart-valve replacement: Secondary | ICD-10-CM | POA: Diagnosis not present

## 2013-09-06 DIAGNOSIS — Z7901 Long term (current) use of anticoagulants: Secondary | ICD-10-CM | POA: Diagnosis not present

## 2013-09-06 LAB — POCT INR: INR: 2.1

## 2013-09-07 ENCOUNTER — Ambulatory Visit: Payer: Medicare Other

## 2013-09-11 ENCOUNTER — Other Ambulatory Visit: Payer: Self-pay | Admitting: Interventional Cardiology

## 2013-09-26 ENCOUNTER — Ambulatory Visit: Payer: Medicare Other | Admitting: Interventional Cardiology

## 2013-10-04 ENCOUNTER — Ambulatory Visit (INDEPENDENT_AMBULATORY_CARE_PROVIDER_SITE_OTHER): Payer: Medicare Other | Admitting: Pharmacist

## 2013-10-04 DIAGNOSIS — Z954 Presence of other heart-valve replacement: Secondary | ICD-10-CM

## 2013-10-04 DIAGNOSIS — I059 Rheumatic mitral valve disease, unspecified: Secondary | ICD-10-CM

## 2013-10-04 DIAGNOSIS — Z7901 Long term (current) use of anticoagulants: Secondary | ICD-10-CM

## 2013-10-04 LAB — POCT INR: INR: 2.5

## 2013-10-13 ENCOUNTER — Ambulatory Visit: Payer: Medicare Other | Admitting: Interventional Cardiology

## 2013-10-29 ENCOUNTER — Encounter (HOSPITAL_COMMUNITY): Payer: Self-pay | Admitting: Emergency Medicine

## 2013-10-29 ENCOUNTER — Emergency Department (INDEPENDENT_AMBULATORY_CARE_PROVIDER_SITE_OTHER)
Admission: EM | Admit: 2013-10-29 | Discharge: 2013-10-29 | Disposition: A | Discharge status: Home / Self Care | Payer: Medicare Other | Source: Home / Self Care | Attending: Emergency Medicine | Admitting: Emergency Medicine

## 2013-10-29 DIAGNOSIS — N3001 Acute cystitis with hematuria: Secondary | ICD-10-CM

## 2013-10-29 DIAGNOSIS — R350 Frequency of micturition: Secondary | ICD-10-CM | POA: Diagnosis not present

## 2013-10-29 DIAGNOSIS — N3000 Acute cystitis without hematuria: Secondary | ICD-10-CM | POA: Diagnosis not present

## 2013-10-29 LAB — POCT URINALYSIS DIP (DEVICE)
Glucose, UA: 250 mg/dL — AB
Ketones, ur: 15 mg/dL — AB
Nitrite: POSITIVE — AB
Protein, ur: >=300 mg/dL — AB
Specific Gravity, Urine: 1.015 (ref 1.005–1.030)
Urobilinogen, UA: >=8 mg/dL (ref 0.0–1.0)
pH: 7 (ref 5.0–8.0)

## 2013-10-29 LAB — GLUCOSE, CAPILLARY: Glucose-Capillary: 75 mg/dL (ref 70–99)

## 2013-10-29 MED ORDER — CEPHALEXIN 500 MG PO CAPS: 500.0000 mg | ORAL_CAPSULE | Freq: Three times a day (TID) | ORAL | Status: DC

## 2013-10-29 MED ORDER — PHENAZOPYRIDINE HCL 200 MG PO TABS: 200.0000 mg | ORAL_TABLET | Freq: Three times a day (TID) | ORAL | Status: DC | PRN

## 2013-10-29 NOTE — ED Provider Notes (Signed)
Chief Complaint   Chief Complaint  Patient presents with  . Urinary Tract Infection    History of Present Illness   Jasmine Kelley is a 68 year old female who has had a three-day history of dysuria, frequency, and urgency. She denies any hematuria. She's had some burning in her bladder area and lower back pain. She denies any fever, chills, nausea, or vomiting. She's had no GYN complaints, and she's not sexually active. She has had no prior history of urinary tract infections in the past.  Review of Systems   Other than as noted above, the patient denies any of the following symptoms: General:  No fevers or chills. GI:  No abdominal pain, back pain, nausea, or vomiting. GU:  No hematuria or incontinence. GYN:  No discharge, itching, vulvar pain or lesions, pelvic pain, or abnormal vaginal bleeding.  [place]   Past medical history, family history, social history, meds, and allergies were reviewed.    Physical Examination     Vital signs:  BP 149/79  Pulse 87  Temp(Src) 97.8 F (36.6 C) (Oral)  Resp 18 Gen:  Alert, oriented, in no distress. Lungs:  Clear to auscultation, no wheezes, rales or rhonchi. Heart:  Regular rhythm, no gallop or murmer. Abdomen:  Flat and soft. There was slight suprapubic pain to palpation.  No guarding, or rebound.  No hepato-splenomegaly or mass.  Bowel sounds were normally active.  No hernia. Back:  No CVA tenderness.  Skin:  Clear, warm and dry.  Labs   Results for orders placed during the hospital encounter of 10/29/13  GLUCOSE, CAPILLARY      Result Value Ref Range   Glucose-Capillary 75  70 - 99 mg/dL  POCT URINALYSIS DIP (DEVICE)      Result Value Ref Range   Glucose, UA 250 (*) NEGATIVE mg/dL   Bilirubin Urine MODERATE (*) NEGATIVE   Ketones, ur 15 (*) NEGATIVE mg/dL   Specific Gravity, Urine 1.015  1.005 - 1.030   Hgb urine dipstick TRACE (*) NEGATIVE   pH 7.0  5.0 - 8.0   Protein, ur >=300 (*) NEGATIVE mg/dL   Urobilinogen, UA  >=8.0  0.0 - 1.0 mg/dL   Nitrite POSITIVE (*) NEGATIVE   Leukocytes, UA LARGE (*) NEGATIVE     A urine culture was obtained.  Results are pending at this time and we will call about any positive results.  Assessment   The encounter diagnosis was Acute cystitis with hematuria.   No evidence of pyelonephritis.    Plan   1.  Meds:  The following meds were prescribed:   Discharge Medication List as of 10/29/2013  8:01 PM    START taking these medications   Details  cephALEXin (KEFLEX) 500 MG capsule Take 1 capsule (500 mg total) by mouth 3 (three) times daily., Starting 10/29/2013, Until Discontinued, Normal    phenazopyridine (PYRIDIUM) 200 MG tablet Take 1 tablet (200 mg total) by mouth 3 (three) times daily as needed for pain., Starting 10/29/2013, Until Discontinued, Normal        2.  Patient Education/Counseling:  The patient was given appropriate handouts, self care instructions, and instructed in symptomatic relief. The patient was told to avoid intercourse for 10 days, get extra fluids, and return for a follow up with her primary care doctor at the completion of treatment for a repeat UA and culture.    3.  Follow up:  The patient was told to follow up here if no better in 3 to 4  days, or sooner if becoming worse in any way, and given some red flag symptoms such as fever, persistent vomiting, or severe flank or abdominal pain which would prompt immediate return.     Harden Mo, MD 10/29/13 2152

## 2013-10-29 NOTE — ED Notes (Signed)
C/o  Dysuria.  Urgency. Frequency.  Since Friday.  Denies fever, n/v/d  No relief with otc meds.

## 2013-10-29 NOTE — Discharge Instructions (Signed)

## 2013-10-31 ENCOUNTER — Telehealth: Payer: Self-pay | Admitting: *Deleted

## 2013-10-31 LAB — URINE CULTURE: Colony Count: 60000

## 2013-10-31 NOTE — Telephone Encounter (Signed)
Pt called stating was seen in ER on June 28th diagnosed with UTI and placed on Pyridium  200 mg tab1 tid as needed for pain and Keflex 500mg  tid . Pt states she does not feel like keeping her appt tomorrow so appt changed to July 8th at pt's request

## 2013-10-31 NOTE — ED Notes (Signed)
Urine culture: 60,000 colonies Klebsiella Pneumoniae. Pt. adequately treated with Keflex. Roselyn Meier 10/31/2013

## 2013-10-31 NOTE — Progress Notes (Signed)
Quick Note:  Results are abnormal as noted, but have been adequately treated. No further action necessary. ______ 

## 2013-11-16 DIAGNOSIS — E039 Hypothyroidism, unspecified: Secondary | ICD-10-CM | POA: Diagnosis not present

## 2013-11-16 DIAGNOSIS — E782 Mixed hyperlipidemia: Secondary | ICD-10-CM | POA: Diagnosis not present

## 2013-11-16 DIAGNOSIS — R5381 Other malaise: Secondary | ICD-10-CM | POA: Diagnosis not present

## 2013-11-16 DIAGNOSIS — R5383 Other fatigue: Secondary | ICD-10-CM | POA: Diagnosis not present

## 2013-11-16 DIAGNOSIS — G479 Sleep disorder, unspecified: Secondary | ICD-10-CM | POA: Diagnosis not present

## 2013-11-16 DIAGNOSIS — F329 Major depressive disorder, single episode, unspecified: Secondary | ICD-10-CM | POA: Diagnosis not present

## 2013-12-05 ENCOUNTER — Encounter: Payer: Self-pay | Admitting: Cardiology

## 2013-12-05 DIAGNOSIS — I4891 Unspecified atrial fibrillation: Secondary | ICD-10-CM | POA: Insufficient documentation

## 2013-12-05 DIAGNOSIS — Z952 Presence of prosthetic heart valve: Secondary | ICD-10-CM

## 2013-12-05 DIAGNOSIS — E039 Hypothyroidism, unspecified: Secondary | ICD-10-CM | POA: Insufficient documentation

## 2013-12-05 DIAGNOSIS — E782 Mixed hyperlipidemia: Secondary | ICD-10-CM | POA: Insufficient documentation

## 2013-12-05 DIAGNOSIS — I480 Paroxysmal atrial fibrillation: Secondary | ICD-10-CM

## 2013-12-08 ENCOUNTER — Encounter: Payer: Self-pay | Admitting: Interventional Cardiology

## 2013-12-08 ENCOUNTER — Telehealth: Payer: Self-pay | Admitting: Interventional Cardiology

## 2013-12-08 ENCOUNTER — Ambulatory Visit (INDEPENDENT_AMBULATORY_CARE_PROVIDER_SITE_OTHER): Payer: Medicare Other | Admitting: Pharmacist

## 2013-12-08 ENCOUNTER — Ambulatory Visit (INDEPENDENT_AMBULATORY_CARE_PROVIDER_SITE_OTHER): Payer: Medicare Other | Admitting: Interventional Cardiology

## 2013-12-08 VITALS — BP 145/87 | HR 95 | Ht 69.0 in | Wt 132.0 lb

## 2013-12-08 DIAGNOSIS — I4891 Unspecified atrial fibrillation: Secondary | ICD-10-CM

## 2013-12-08 DIAGNOSIS — Z7901 Long term (current) use of anticoagulants: Secondary | ICD-10-CM

## 2013-12-08 DIAGNOSIS — Z954 Presence of other heart-valve replacement: Secondary | ICD-10-CM

## 2013-12-08 DIAGNOSIS — E782 Mixed hyperlipidemia: Secondary | ICD-10-CM | POA: Diagnosis not present

## 2013-12-08 DIAGNOSIS — I480 Paroxysmal atrial fibrillation: Secondary | ICD-10-CM

## 2013-12-08 DIAGNOSIS — I059 Rheumatic mitral valve disease, unspecified: Secondary | ICD-10-CM

## 2013-12-08 DIAGNOSIS — Z952 Presence of prosthetic heart valve: Secondary | ICD-10-CM

## 2013-12-08 LAB — POCT INR: INR: 2

## 2013-12-08 NOTE — Telephone Encounter (Signed)
TG are 320 mg/dL and LDL 55 mg/dL.  Patient on warfarin for h/o MVR, and also has h/o GI bleed.  Will have patient start low dose fish oil for now, and recheck lipid panel and hepatic panel in 4 months. Plan: 1.  Continue low dose simvastatin. 2.  Add fish oil 1,000 mg capsules- take 2 per day. 3.  Recheck lipid panel and hepatic panel in 4 months. Please notify patient, update meds, and set up labs. Thanks.

## 2013-12-08 NOTE — Telephone Encounter (Signed)
Please look at lipids. Triglycerides elevated.

## 2013-12-08 NOTE — Patient Instructions (Signed)
Your physician recommends that you continue on your current medications as directed. Please refer to the Current Medication list given to you today.  Your physician wants you to follow-up in: 1 year with Dr. Varanasi. You will receive a reminder letter in the mail two months in advance. If you don't receive a letter, please call our office to schedule the follow-up appointment.  

## 2013-12-08 NOTE — Progress Notes (Signed)
Patient ID: Jasmine Kelley, female   DOB: 1945/06/18, 68 y.o.   MRN: [id number]    [address], [address] [place], [place]  [address] Phone: [phone] Fax:  [phone]  Date:  12/08/2013   ID:  Jasmine Kelley, [place] [birth year], MRN [id number]  PCP:  Wenda Low, MD      History of Present Illness: Jasmine Kelley is a 68 y.o. female who has a history of a mechanical mitral valve replacement many years ago. I  saw her in 2011 prior to shoulder surgery. She presents today for routine annual followup. Overall, she has been feeling fairly well. She does not report any chest pain. She has some mild shortness of breath with significant exertion, but this is unchanged. She has not had any significant bleeding problems.  She had some problems with fatigue when she gets stung by a bee.    Likes to walk in the woods.     Wt Readings from Last 3 Encounters:  12/08/13 132 lb (59.875 kg)  04/16/08 142 lb 2.1 oz (64.47 kg)  03/21/08 144 lb 2.1 oz (65.378 kg)     Past Medical History  Diagnosis Date  . Rectus sheath hematoma     2005  . S/P MVR (mitral valve replacement)     mechanical, due to Mitral Stenosis  . Chronic anticoagulation     coumadin, AFIB  . A-fib   . Major depression   . Pulmonary HTN   . PVD (peripheral vascular disease)   . Dermatitis   . Bell's palsy   . Anxiety   . Hypothyroidism   . Allergic rhinitis   . Left shoulder pain   . Cerebellar stroke, acute     11/11/09  . 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 CVA (cerebral infarction)   . Osteoporosis     BMD 2013    Current Outpatient Prescriptions  Medication Sig Dispense Refill  . alendronate (FOSAMAX) 70 MG tablet Take 70 mg by mouth once a week.       . calcium carbonate (TUMS - DOSED IN MG ELEMENTAL CALCIUM) 500 MG chewable tablet Chew 1 tablet by mouth daily.      . DULoxetine (CYMBALTA) 60 MG capsule Take 60 mg by mouth daily.       . ferrous fumarate (HEMOCYTE - 106 MG FE)  325 (106 FE) MG TABS tablet Take 1 tablet by mouth daily.      . phenazopyridine (PYRIDIUM) 200 MG tablet Take 1 tablet (200 mg total) by mouth 3 (three) times daily as needed for pain.  15 tablet  0  . simvastatin (ZOCOR) 10 MG tablet Take 10 mg by mouth daily at 6 PM.       . SYNTHROID 88 MCG tablet Take 88 mcg by mouth daily before breakfast.       . valACYclovir (VALTREX) 500 MG tablet Take 500 mg by mouth daily.       . Vitamin D, Cholecalciferol, 1000 UNITS TABS Take 1 tablet by mouth daily.      Marland Kitchen warfarin (COUMADIN) 2 MG tablet 1 tablet every day except 2 tablets on Wednesday or as directed by coumadin clinic  40 tablet  3  . zinc gluconate 50 MG tablet Take 50 mg by mouth daily.       No current facility-administered medications for this visit.    Allergies:    Allergies  Allergen Reactions  . Bee Venom  Bee stings  . Benadryl [Diphenhydramine Hcl (Sleep)]     jittery  . Chlorpromazine Hcl   . Peanut Butter Flavor Nausea Only    dizziness    Social History:  The patient  reports that she has never smoked. She does not have any smokeless tobacco history on file. She reports that she drinks alcohol. She reports that she does not use illicit drugs.   Family History:  The patient's family history includes Breast cancer in her mother; CVA in her father; Diabetes in her father.   ROS:  Please see the history of present illness.  No nausea, vomiting.  No fevers, chills.  No focal weakness.  No dysuria.  Occasional alcohol intake, she adjusts coumadin based on this.   All other systems reviewed and negative.   PHYSICAL EXAM: VS:  BP 145/87  Pulse 95  Ht 5\' 9"  (1.753 m)  Wt 132 lb (59.875 kg)  BMI 19.48 kg/m2 Well nourished, well developed, in no acute distress HEENT: normal Neck: no JVD, no carotid bruits Cardiac:  normal crisp S1 click, S2; RRR;  Lungs:  clear to auscultation bilaterally, no wheezing, rhonchi or rales Abd: soft, nontender, no hepatomegaly Ext: no  edema Skin: warm and dry Neuro:   no focal abnormalities noted  EKG:  NSR, NSST     ASSESSMENT AND PLAN:  Atrial fibrillation  IMAGING: EKG    Harward,Amy 08/04/2012 10:55:23 AM > Esco Joslyn,JAY 08/04/2012 11:16:18 AM > Normal   Notes: maintaining sinus rhythm at this time. She requires Coumadin for her valve. This will also give stroke prevention.    2. Heart valve replaced by other means  Notes: continue SBE prophylaxis prior to dental treatments.  S/p mechanical mitral valve   3. Hypertriglyceridemia  Notes: elevated triglycerides on last check in August 2013. LDL controlled. continue eating healthy diet. LDL 55 in 2015.  TG 321. HDL 38 . Will have PharmD weigh in on lipids.   4. Anticoagulant long-term use  Notes: INR to be checked in Coumadin dose to be adjusted with Ysidro Evert. Please see his note for details.    Preventive Medicine  Adult topics discussed:  Diet: healthy diet.  Exercise: at least 30 minutes of aerobic exercise, 5 days a week.      Signed, Mina Marble, MD, [facility] 12/08/2013 12:09 PM

## 2013-12-08 NOTE — Telephone Encounter (Signed)
TG levels up to 320 mg/dL.

## 2013-12-14 MED ORDER — FISH OIL 1000 MG PO CAPS: 2000.0000 mg | ORAL_CAPSULE | Freq: Every day | ORAL | Status: DC

## 2013-12-14 NOTE — Telephone Encounter (Signed)
Pt notified, meds updated and labs ordered.  

## 2013-12-14 NOTE — Addendum Note (Signed)
Addended byUlla Potash H on: 12/14/2013 09:06 AM   Modules accepted: Orders

## 2014-01-17 DIAGNOSIS — E782 Mixed hyperlipidemia: Secondary | ICD-10-CM | POA: Diagnosis not present

## 2014-01-17 DIAGNOSIS — F411 Generalized anxiety disorder: Secondary | ICD-10-CM | POA: Diagnosis not present

## 2014-01-17 DIAGNOSIS — F329 Major depressive disorder, single episode, unspecified: Secondary | ICD-10-CM | POA: Diagnosis not present

## 2014-01-17 DIAGNOSIS — M810 Age-related osteoporosis without current pathological fracture: Secondary | ICD-10-CM | POA: Diagnosis not present

## 2014-01-17 DIAGNOSIS — E039 Hypothyroidism, unspecified: Secondary | ICD-10-CM | POA: Diagnosis not present

## 2014-02-02 ENCOUNTER — Ambulatory Visit (INDEPENDENT_AMBULATORY_CARE_PROVIDER_SITE_OTHER): Payer: Medicare Other | Admitting: *Deleted

## 2014-02-02 DIAGNOSIS — I059 Rheumatic mitral valve disease, unspecified: Secondary | ICD-10-CM | POA: Diagnosis not present

## 2014-02-02 DIAGNOSIS — Z7901 Long term (current) use of anticoagulants: Secondary | ICD-10-CM | POA: Diagnosis not present

## 2014-02-02 DIAGNOSIS — Z954 Presence of other heart-valve replacement: Secondary | ICD-10-CM

## 2014-02-02 DIAGNOSIS — Z952 Presence of prosthetic heart valve: Secondary | ICD-10-CM

## 2014-02-02 LAB — POCT INR: INR: 3.9

## 2014-02-14 ENCOUNTER — Other Ambulatory Visit: Payer: Self-pay | Admitting: Interventional Cardiology

## 2014-03-21 ENCOUNTER — Ambulatory Visit (INDEPENDENT_AMBULATORY_CARE_PROVIDER_SITE_OTHER): Payer: Medicare Other | Admitting: Pharmacist

## 2014-03-21 DIAGNOSIS — I059 Rheumatic mitral valve disease, unspecified: Secondary | ICD-10-CM | POA: Diagnosis not present

## 2014-03-21 DIAGNOSIS — Z7901 Long term (current) use of anticoagulants: Secondary | ICD-10-CM | POA: Diagnosis not present

## 2014-03-21 DIAGNOSIS — I270 Primary pulmonary hypertension: Secondary | ICD-10-CM | POA: Diagnosis not present

## 2014-03-21 DIAGNOSIS — E039 Hypothyroidism, unspecified: Secondary | ICD-10-CM | POA: Diagnosis not present

## 2014-03-21 DIAGNOSIS — I639 Cerebral infarction, unspecified: Secondary | ICD-10-CM | POA: Diagnosis not present

## 2014-03-21 DIAGNOSIS — E782 Mixed hyperlipidemia: Secondary | ICD-10-CM | POA: Diagnosis not present

## 2014-03-21 DIAGNOSIS — Z952 Presence of prosthetic heart valve: Secondary | ICD-10-CM

## 2014-03-21 DIAGNOSIS — Z954 Presence of other heart-valve replacement: Secondary | ICD-10-CM | POA: Diagnosis not present

## 2014-03-21 DIAGNOSIS — M810 Age-related osteoporosis without current pathological fracture: Secondary | ICD-10-CM | POA: Diagnosis not present

## 2014-03-21 DIAGNOSIS — Z23 Encounter for immunization: Secondary | ICD-10-CM | POA: Diagnosis not present

## 2014-03-21 DIAGNOSIS — I482 Chronic atrial fibrillation: Secondary | ICD-10-CM | POA: Diagnosis not present

## 2014-03-21 DIAGNOSIS — I739 Peripheral vascular disease, unspecified: Secondary | ICD-10-CM | POA: Diagnosis not present

## 2014-03-21 DIAGNOSIS — Z0001 Encounter for general adult medical examination with abnormal findings: Secondary | ICD-10-CM | POA: Diagnosis not present

## 2014-03-21 LAB — POCT INR: INR: 2.3

## 2014-04-11 ENCOUNTER — Inpatient Hospital Stay: Admission: RE | Admit: 2014-04-11 | Payer: Medicare Other | Source: Ambulatory Visit

## 2014-04-16 ENCOUNTER — Other Ambulatory Visit: Payer: Medicare Other

## 2014-05-03 ENCOUNTER — Emergency Department (HOSPITAL_COMMUNITY)
Admission: EM | Admit: 2014-05-03 | Discharge: 2014-05-03 | Disposition: A | Discharge status: Home / Self Care | Payer: Medicare Other | Attending: Emergency Medicine | Admitting: Emergency Medicine

## 2014-05-03 ENCOUNTER — Encounter (HOSPITAL_COMMUNITY): Payer: Self-pay | Admitting: *Deleted

## 2014-05-03 ENCOUNTER — Emergency Department (HOSPITAL_COMMUNITY): Payer: Medicare Other

## 2014-05-03 DIAGNOSIS — R109 Unspecified abdominal pain: Secondary | ICD-10-CM | POA: Diagnosis not present

## 2014-05-03 DIAGNOSIS — F419 Anxiety disorder, unspecified: Secondary | ICD-10-CM | POA: Diagnosis not present

## 2014-05-03 DIAGNOSIS — M810 Age-related osteoporosis without current pathological fracture: Secondary | ICD-10-CM | POA: Diagnosis not present

## 2014-05-03 DIAGNOSIS — Z8673 Personal history of transient ischemic attack (TIA), and cerebral infarction without residual deficits: Secondary | ICD-10-CM | POA: Diagnosis not present

## 2014-05-03 DIAGNOSIS — E039 Hypothyroidism, unspecified: Secondary | ICD-10-CM | POA: Diagnosis not present

## 2014-05-03 DIAGNOSIS — Z7901 Long term (current) use of anticoagulants: Secondary | ICD-10-CM | POA: Insufficient documentation

## 2014-05-03 DIAGNOSIS — Z952 Presence of prosthetic heart valve: Secondary | ICD-10-CM | POA: Insufficient documentation

## 2014-05-03 DIAGNOSIS — I4891 Unspecified atrial fibrillation: Secondary | ICD-10-CM | POA: Diagnosis not present

## 2014-05-03 DIAGNOSIS — F329 Major depressive disorder, single episode, unspecified: Secondary | ICD-10-CM | POA: Diagnosis not present

## 2014-05-03 DIAGNOSIS — R61 Generalized hyperhidrosis: Secondary | ICD-10-CM | POA: Insufficient documentation

## 2014-05-03 DIAGNOSIS — Z8669 Personal history of other diseases of the nervous system and sense organs: Secondary | ICD-10-CM | POA: Diagnosis not present

## 2014-05-03 DIAGNOSIS — R1013 Epigastric pain: Secondary | ICD-10-CM | POA: Diagnosis not present

## 2014-05-03 DIAGNOSIS — Z872 Personal history of diseases of the skin and subcutaneous tissue: Secondary | ICD-10-CM | POA: Diagnosis not present

## 2014-05-03 DIAGNOSIS — R110 Nausea: Secondary | ICD-10-CM | POA: Insufficient documentation

## 2014-05-03 DIAGNOSIS — Z760 Encounter for issue of repeat prescription: Secondary | ICD-10-CM

## 2014-05-03 DIAGNOSIS — R1010 Upper abdominal pain, unspecified: Secondary | ICD-10-CM | POA: Insufficient documentation

## 2014-05-03 DIAGNOSIS — R0602 Shortness of breath: Secondary | ICD-10-CM | POA: Insufficient documentation

## 2014-05-03 DIAGNOSIS — R531 Weakness: Secondary | ICD-10-CM | POA: Insufficient documentation

## 2014-05-03 DIAGNOSIS — R143 Flatulence: Secondary | ICD-10-CM | POA: Insufficient documentation

## 2014-05-03 DIAGNOSIS — Z79899 Other long term (current) drug therapy: Secondary | ICD-10-CM | POA: Diagnosis not present

## 2014-05-03 DIAGNOSIS — R197 Diarrhea, unspecified: Secondary | ICD-10-CM | POA: Insufficient documentation

## 2014-05-03 DIAGNOSIS — N200 Calculus of kidney: Secondary | ICD-10-CM | POA: Diagnosis not present

## 2014-05-03 DIAGNOSIS — Z8719 Personal history of other diseases of the digestive system: Secondary | ICD-10-CM | POA: Diagnosis not present

## 2014-05-03 DIAGNOSIS — R42 Dizziness and giddiness: Secondary | ICD-10-CM | POA: Diagnosis not present

## 2014-05-03 HISTORY — DX: Gastrointestinal hemorrhage, unspecified: K92.2

## 2014-05-03 LAB — URINALYSIS, ROUTINE W REFLEX MICROSCOPIC
Bilirubin Urine: NEGATIVE
Glucose, UA: NEGATIVE mg/dL
Hgb urine dipstick: NEGATIVE
Ketones, ur: 15 mg/dL — AB
Leukocytes, UA: NEGATIVE
Nitrite: NEGATIVE
Protein, ur: 100 mg/dL — AB
Specific Gravity, Urine: 1.021 (ref 1.005–1.030)
Urobilinogen, UA: 0.2 mg/dL (ref 0.0–1.0)
pH: 8.5 — ABNORMAL HIGH (ref 5.0–8.0)

## 2014-05-03 LAB — CBC WITH DIFFERENTIAL/PLATELET
Basophils Absolute: 0 10*3/uL (ref 0.0–0.1)
Basophils Relative: 0 % (ref 0–1)
Eosinophils Absolute: 0.1 10*3/uL (ref 0.0–0.7)
Eosinophils Relative: 2 % (ref 0–5)
HCT: 43.8 % (ref 36.0–46.0)
Hemoglobin: 15.6 g/dL — ABNORMAL HIGH (ref 12.0–15.0)
Lymphocytes Relative: 22 % (ref 12–46)
Lymphs Abs: 1.7 10*3/uL (ref 0.7–4.0)
MCH: 29.8 pg (ref 26.0–34.0)
MCHC: 35.6 g/dL (ref 30.0–36.0)
MCV: 83.6 fL (ref 78.0–100.0)
Monocytes Absolute: 0.5 10*3/uL (ref 0.1–1.0)
Monocytes Relative: 6 % (ref 3–12)
Neutro Abs: 5.4 10*3/uL (ref 1.7–7.7)
Neutrophils Relative %: 70 % (ref 43–77)
Platelets: 300 10*3/uL (ref 150–400)
RBC: 5.24 MIL/uL — ABNORMAL HIGH (ref 3.87–5.11)
RDW: 12.8 % (ref 11.5–15.5)
WBC: 7.7 10*3/uL (ref 4.0–10.5)

## 2014-05-03 LAB — COMPREHENSIVE METABOLIC PANEL
ALT: 203 U/L — ABNORMAL HIGH (ref 0–35)
AST: 187 U/L — ABNORMAL HIGH (ref 0–37)
Albumin: 4.4 g/dL (ref 3.5–5.2)
Alkaline Phosphatase: 109 U/L (ref 39–117)
Anion gap: 10 (ref 5–15)
BUN: 13 mg/dL (ref 6–23)
CO2: 23 mmol/L (ref 19–32)
Calcium: 9.6 mg/dL (ref 8.4–10.5)
Chloride: 104 mEq/L (ref 96–112)
Creatinine, Ser: 0.85 mg/dL (ref 0.50–1.10)
GFR calc Af Amer: 81 mL/min — ABNORMAL LOW (ref >90)
GFR calc non Af Amer: 70 mL/min — ABNORMAL LOW (ref >90)
Glucose, Bld: 109 mg/dL — ABNORMAL HIGH (ref 70–99)
Potassium: 4.3 mmol/L (ref 3.5–5.1)
Sodium: 137 mmol/L (ref 135–145)
Total Bilirubin: 0.8 mg/dL (ref 0.3–1.2)
Total Protein: 7.7 g/dL (ref 6.0–8.3)

## 2014-05-03 LAB — I-STAT ARTERIAL BLOOD GAS, ED
Acid-base deficit: 1 mmol/L (ref 0.0–2.0)
Bicarbonate: 18.1 mEq/L — ABNORMAL LOW (ref 20.0–24.0)
O2 Saturation: 99 %
Patient temperature: 98.6
TCO2: 19 mmol/L (ref 0–100)
pCO2 arterial: 19.5 mmHg — CL (ref 35.0–45.0)
pH, Arterial: 7.574 — ABNORMAL HIGH (ref 7.350–7.450)
pO2, Arterial: 116 mmHg — ABNORMAL HIGH (ref 80.0–100.0)

## 2014-05-03 LAB — I-STAT TROPONIN, ED
Troponin i, poc: 0 ng/mL (ref 0.00–0.08)
Troponin i, poc: 0 ng/mL (ref 0.00–0.08)

## 2014-05-03 LAB — URINE MICROSCOPIC-ADD ON

## 2014-05-03 LAB — APTT: aPTT: 34 seconds (ref 24–37)

## 2014-05-03 LAB — PROTIME-INR
INR: 1.45 (ref 0.00–1.49)
Prothrombin Time: 17.8 seconds — ABNORMAL HIGH (ref 11.6–15.2)

## 2014-05-03 LAB — LIPASE, BLOOD: Lipase: 29 U/L (ref 11–59)

## 2014-05-03 MED ORDER — WARFARIN SODIUM 2 MG PO TABS: 2.0000 mg | ORAL_TABLET | Freq: Every day | ORAL | Status: DC

## 2014-05-03 MED ORDER — WARFARIN - PHYSICIAN DOSING INPATIENT: Freq: Every day | Status: DC

## 2014-05-03 MED ORDER — LORAZEPAM 2 MG/ML IJ SOLN
0.5000 mg | Freq: Once | INTRAMUSCULAR | Status: AC
  Administered 2014-05-03: 0.5 mg via INTRAVENOUS
  Filled 2014-05-03: qty 1

## 2014-05-03 MED ORDER — ENOXAPARIN SODIUM 100 MG/ML ~~LOC~~ SOLN
90.0000 mg | Freq: Once | SUBCUTANEOUS | Status: AC
  Administered 2014-05-03: 90 mg via SUBCUTANEOUS
  Filled 2014-05-03: qty 1

## 2014-05-03 MED ORDER — ENOXAPARIN SODIUM 100 MG/ML ~~LOC~~ SOLN: 90.0000 mg | SUBCUTANEOUS | Status: DC

## 2014-05-03 MED ORDER — WARFARIN SODIUM 5 MG PO TABS
5.0000 mg | ORAL_TABLET | Freq: Once | ORAL | Status: AC
  Administered 2014-05-03: 5 mg via ORAL
  Filled 2014-05-03: qty 1

## 2014-05-03 MED ORDER — LORAZEPAM 1 MG PO TABS: 0.5000 mg | ORAL_TABLET | Freq: Four times a day (QID) | ORAL | Status: AC | PRN

## 2014-05-03 NOTE — Discharge Instructions (Signed)
Abdominal Pain Many things can cause abdominal pain. Usually, abdominal pain is not caused by a disease and will improve without treatment. It can often be observed and treated at home. Your health care provider will do a physical exam and possibly order blood tests and X-rays to help determine the seriousness of your pain. However, in many cases, more time must pass before a clear cause of the pain can be found. Before that point, your health care provider may not know if you need more testing or further treatment. HOME CARE INSTRUCTIONS  Monitor your abdominal pain for any changes. The following actions may help to alleviate any discomfort you are experiencing:  Only take over-the-counter or prescription medicines as directed by your health care provider.  Do not take laxatives unless directed to do so by your health care provider.  Try a clear liquid diet (broth, tea, or water) as directed by your health care provider. Slowly move to a bland diet as tolerated. SEEK MEDICAL CARE IF:  You have unexplained abdominal pain.  You have abdominal pain associated with nausea or diarrhea.  You have pain when you urinate or have a bowel movement.  You experience abdominal pain that wakes you in the night.  You have abdominal pain that is worsened or improved by eating food.  You have abdominal pain that is worsened with eating fatty foods.  You have a fever. SEEK IMMEDIATE MEDICAL CARE IF:   Your pain does not go away within 2 hours.  You keep throwing up (vomiting).  Your pain is felt only in portions of the abdomen, such as the right side or the left lower portion of the abdomen.  You pass bloody or black tarry stools. MAKE SURE YOU:  Understand these instructions.   Will watch your condition.   Will get help right away if you are not doing well or get worse.  Document Released: 01/28/2005 Document Revised: 04/25/2013 Document Reviewed: 12/28/2012 [facility] Patient Information  2015 [place], [place]. This information is not intended to replace advice given to you by your health care provider. Make sure you discuss any questions you have with your health care provider. Weakness Weakness is a lack of strength. It may be felt all over the body (generalized) or in one specific part of the body (focal). Some causes of weakness can be serious. You may need further medical evaluation, especially if you are elderly or you have a history of immunosuppression (such as chemotherapy or HIV), kidney disease, heart disease, or diabetes. CAUSES  Weakness can be caused by many different things, including:  Infection.  Physical exhaustion.  Internal bleeding or other blood loss that results in a lack of red blood cells (anemia).  Dehydration. This cause is more common in elderly people.  Side effects or electrolyte abnormalities from medicines, such as pain medicines or sedatives.  Emotional distress, anxiety, or depression.  Circulation problems, especially severe peripheral arterial disease.  Heart disease, such as rapid atrial fibrillation, bradycardia, or heart failure.  Nervous system disorders, such as Guillain-Barr syndrome, multiple sclerosis, or stroke. DIAGNOSIS  To find the cause of your weakness, your caregiver will take your history and perform a physical exam. Lab tests or X-rays may also be ordered, if needed. TREATMENT  Treatment of weakness depends on the cause of your symptoms and can vary greatly. HOME CARE INSTRUCTIONS   Rest as needed.  Eat a well-balanced diet.  Try to get some exercise every day.  Only take over-the-counter or prescription medicines  as directed by your caregiver. SEEK MEDICAL CARE IF:   Your weakness seems to be getting worse or spreads to other parts of your body.  You develop new aches or pains. SEEK IMMEDIATE MEDICAL CARE IF:   You cannot perform your normal daily activities, such as getting dressed and feeding  yourself.  You cannot walk up and down stairs, or you feel exhausted when you do so.  You have shortness of breath or chest pain.  You have difficulty moving parts of your body.  You have weakness in only one area of the body or on only one side of the body.  You have a fever.  You have trouble speaking or swallowing.  You cannot control your bladder or bowel movements.  You have black or bloody vomit or stools. MAKE SURE YOU:  Understand these instructions.  Will watch your condition.  Will get help right away if you are not doing well or get worse. Document Released: 04/20/2005 Document Revised: 10/20/2011 Document Reviewed: 06/19/2011 [facility] Patient Information 2015 [place], [place]. This information is not intended to replace advice given to you by your health care provider. Make sure you discuss any questions you have with your health care provider.

## 2014-05-03 NOTE — ED Notes (Signed)
Per Lab, blue top was clotted. Scott RN notified.

## 2014-05-03 NOTE — ED Provider Notes (Signed)
CSN: [id number]     Arrival date & time 05/03/14  1214 History   First MD Initiated Contact with Patient 05/03/14 1314     Chief Complaint  Patient presents with  . Dizziness  . Weakness    Patient is a 68 y.o. female presenting with dizziness and weakness. The history is provided by the patient.  Dizziness Quality:  Lightheadedness and imbalance Severity:  Moderate Onset quality:  Gradual Duration:  1 week Timing:  Intermittent Progression:  Worsening Chronicity:  New Associated symptoms: diarrhea and shortness of breath   Associated symptoms: no blood in stool   Weakness Associated symptoms include abdominal pain and shortness of breath.   the patient has been having trouble with pain in her upper abdomen when she eats. She is feeling nauseated and passing a lot of gas. She denies any history of prior gallbladder problems. Patient started feeling weak and lightheaded after episode of abdominal pain. So noticed that she now has developed cramping and spasms in her arms and legs. She is feeling anxious and short of breath.  Past Medical History  Diagnosis Date  . Rectus sheath hematoma     2005  . S/P MVR (mitral valve replacement)     mechanical, due to Mitral Stenosis  . Chronic anticoagulation     coumadin, AFIB  . A-fib   . Major depression   . Pulmonary HTN   . PVD (peripheral vascular disease)   . Dermatitis   . Bell's palsy   . Anxiety   . Hypothyroidism   . Allergic rhinitis   . Left shoulder pain   . Cerebellar stroke, acute     11/11/09  . 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 CVA (cerebral infarction)   . Osteoporosis     BMD 2013  . GI bleed    History reviewed. No pertinent past surgical history. Family History  Problem Relation Age of Onset  . Breast cancer Mother   . CVA Father   . Diabetes Father    History  Substance Use Topics  . Smoking status: Never Smoker   . Smokeless tobacco: Not on file  . Alcohol Use: Yes   OB  History    No data available     Review of Systems  Constitutional: Positive for diaphoresis. Negative for fever.  Respiratory: Positive for shortness of breath. Negative for cough.   Gastrointestinal: Positive for abdominal pain and diarrhea. Negative for blood in stool.  Musculoskeletal: Positive for gait problem.  Skin: Negative for rash.  Neurological: Positive for dizziness and weakness.      Allergies  Bee venom; Benadryl; Chlorpromazine hcl; and Peanut butter flavor  Home Medications   Prior to Admission medications   Medication Sig Start Date End Date Taking? Authorizing Provider  alendronate (FOSAMAX) 70 MG tablet Take 70 mg by mouth once a week.  10/28/13   Historical Provider, MD  calcium carbonate (TUMS - DOSED IN MG ELEMENTAL CALCIUM) 500 MG chewable tablet Chew 1 tablet by mouth daily.    Historical Provider, MD  DULoxetine (CYMBALTA) 60 MG capsule Take 60 mg by mouth daily.  10/28/13   Historical Provider, MD  ferrous fumarate (HEMOCYTE - 106 MG FE) 325 (106 FE) MG TABS tablet Take 1 tablet by mouth daily.    Historical Provider, MD  Omega-3 Fatty Acids (FISH OIL) 1000 MG CAPS Take 2 capsules (2,000 mg total) by mouth daily. 12/14/13   Jettie Booze, MD  phenazopyridine (  PYRIDIUM) 200 MG tablet Take 1 tablet (200 mg total) by mouth 3 (three) times daily as needed for pain. 10/29/13   Harden Mo, MD  simvastatin (ZOCOR) 10 MG tablet Take 10 mg by mouth daily at 6 PM.  10/17/13   Historical Provider, MD  SYNTHROID 88 MCG tablet Take 88 mcg by mouth daily before breakfast.  10/28/13   Historical Provider, MD  valACYclovir (VALTREX) 500 MG tablet Take 500 mg by mouth daily.  10/28/13   Historical Provider, MD  Vitamin D, Cholecalciferol, 1000 UNITS TABS Take 1 tablet by mouth daily.    Historical Provider, MD  warfarin (COUMADIN) 2 MG tablet 1 TABLET EVERY DAY EXCEPT 2 TABLETS ON WEDNESDAY OR AS DIRECTED BY COUMADIN CLINIC 02/14/14   Jettie Booze, MD  zinc  gluconate 50 MG tablet Take 50 mg by mouth daily.    Historical Provider, MD   BP 152/73 mmHg  Pulse 89  Temp(Src) 97.5 F (36.4 C) (Oral)  Resp 19  SpO2 100% Physical Exam  Constitutional: She is oriented to person, place, and time. She appears well-developed and well-nourished. No distress.  HENT:  Head: Normocephalic and atraumatic.  Right Ear: External ear normal.  Left Ear: External ear normal.  Mouth/Throat: Oropharynx is clear and moist.  Eyes: Conjunctivae are normal. Right eye exhibits no discharge. Left eye exhibits no discharge. No scleral icterus.  Neck: Neck supple. No tracheal deviation present.  Cardiovascular: Normal rate, regular rhythm and intact distal pulses.   Pulmonary/Chest: Effort normal and breath sounds normal. No stridor. Tachypnea noted. No respiratory distress. She has no wheezes. She has no rales.  Hyperventilating   Abdominal: Soft. Bowel sounds are normal. She exhibits no distension. There is tenderness in the epigastric area. There is no rigidity, no rebound and no guarding.  Musculoskeletal: She exhibits no edema or tenderness.  Neurological: She is alert and oriented to person, place, and time. She has normal strength. No cranial nerve deficit (no facial droop, extraocular movements intact, no slurred speech) or sensory deficit. She exhibits normal muscle tone. She displays no seizure activity. Coordination normal.  No pronator drift bilateral upper extrem, able to hold both legs off bed for 5 seconds, sensation intact in all extremities, no visual field cuts, no left or right sided neglect, normal finger-nose exam bilaterally, no nystagmus noted   Skin: Skin is warm and dry. No rash noted.  Psychiatric: Her mood appears anxious.  Nursing note and vitals reviewed.   ED Course  Procedures (including critical care time) Labs Review Labs Reviewed  CBC WITH DIFFERENTIAL - Abnormal; Notable for the following:    RBC 5.24 (*)    Hemoglobin 15.6 (*)     All other components within normal limits  COMPREHENSIVE METABOLIC PANEL - Abnormal; Notable for the following:    Glucose, Bld 109 (*)    AST 187 (*)    ALT 203 (*)    GFR calc non Af Amer 70 (*)    GFR calc Af Amer 81 (*)    All other components within normal limits  URINALYSIS, ROUTINE W REFLEX MICROSCOPIC - Abnormal; Notable for the following:    Color, Urine AMBER (*)    pH 8.5 (*)    Ketones, ur 15 (*)    Protein, ur 100 (*)    All other components within normal limits  PROTIME-INR - Abnormal; Notable for the following:    Prothrombin Time 17.8 (*)    All other components within normal limits  URINE MICROSCOPIC-ADD ON - Abnormal; Notable for the following:    Squamous Epithelial / LPF FEW (*)    Bacteria, UA FEW (*)    All other components within normal limits  I-STAT ARTERIAL BLOOD GAS, ED - Abnormal; Notable for the following:    pH, Arterial 7.574 (*)    pCO2 arterial 19.5 (*)    pO2, Arterial 116.0 (*)    Bicarbonate 18.1 (*)    All other components within normal limits  LIPASE, BLOOD  APTT  I-STAT TROPOININ, ED  I-STAT TROPOININ, ED  I-STAT TROPOININ, ED    Imaging Review Ct Head Wo Contrast  05/03/2014   CLINICAL DATA:  Two week history of dizziness  EXAM: CT HEAD WITHOUT CONTRAST  TECHNIQUE: Contiguous axial images were obtained from the base of the skull through the vertex without intravenous contrast.  COMPARISON:  Head CT November 24, 2009 and brain MRI November 25, 2009  FINDINGS: Mild diffuse atrophy is stable. There is no intracranial mass, hemorrhage, extra-axial fluid collection, or midline shift. There is rather minimal small vessel disease in the centra semiovale bilaterally. A prior small lacunar infarct in the mid right cerebellum seen on prior MR is not appreciable on this study. No new gray-white compartment lesions are identified. There is no demonstrable acute infarct on this study. Bony calvarium appears intact. The mastoid air cells are clear.   IMPRESSION: Atrophy with mild periventricular small vessel disease. No intracranial mass, hemorrhage, or acute appearing infarct.   Electronically Signed   By: Lowella Grip M.D.   On: 05/03/2014 13:57     EKG Interpretation   Date/Time:  Thursday May 03 2014 12:22:13 EST Ventricular Rate:  77 PR Interval:  154 QRS Duration: 70 QT Interval:  402 QTC Calculation: 235 R Axis:   75 Text Interpretation:  Normal sinus rhythm Nonspecific ST and T wave  abnormality Abnormal ECG Poor data quality No significant change since  last tracing Confirmed by Quy Lotts  MD-J, Steph Cheadle ([id number]) on 05/03/2014 1:17:27  PM      MDM   Final diagnoses:  Abdominal pain, acute    The patient's symptoms earlier were related to hyperventilation. Her blood gas shows an increased pH and decreased PCO2. The patient's symptoms did improve after a dose of Ativan.  The patient has been having trouble with upper abdominal pain. Her LFTs are elevated. It is possible she is having issues with biliary colic. Plan on checking an abdominal ultrasound.  The patient incidentally mentioned that she ran out of her Coumadin. She is requesting a refill of her medications.  We can address that after her evaluation.   Dorie Rank, MD 05/03/14 [phone]

## 2014-05-03 NOTE — ED Provider Notes (Signed)
The patient is signed out from Dr. Dorie Rank. Plan was for ultrasound of the gallbladder to evaluate patient's pain and mildly elevated LFTs. At this point in time there has been a delay in ultrasound study. The patient reports she wishes to go home and follow up with her family physician to pursue outpatient diagnostic ultrasound. The patient has also requested a refill on her Coumadin.  After interviewing the patient I have discovered that the patient has a mechanical heart valve and missed about 3 days of coumadin. She is sub-therapeutic on her INR. Consultation was made with cardiology. The patient will be started on Lovenox and Coumadin with follow-up.  Charlesetta Shanks, MD 05/06/14 2312

## 2014-05-03 NOTE — ED Notes (Addendum)
Pt reports not feeling well x 2 weeks. Pt is having episodes of dizziness, abd pain, n/v diaphoresis and sob. ekg done at triage. Reports pain gets worse after eating. Also reports feeling bloated, feels similar to GI bleed in past.

## 2014-05-03 NOTE — ED Notes (Signed)
Gave patient something to drink per RN Nicki Reaper

## 2014-05-03 NOTE — ED Notes (Signed)
Spoke with pharmacy and case manager regarding pt's prescription for coumadin and lovenox.

## 2014-05-03 NOTE — ED Notes (Signed)
Patient transported to Ultrasound 

## 2014-05-04 ENCOUNTER — Telehealth: Payer: Self-pay | Admitting: Cardiology

## 2014-05-04 ENCOUNTER — Telehealth: Payer: Self-pay | Admitting: *Deleted

## 2014-05-04 ENCOUNTER — Other Ambulatory Visit: Payer: Self-pay | Admitting: Cardiology

## 2014-05-04 DIAGNOSIS — Z952 Presence of prosthetic heart valve: Secondary | ICD-10-CM

## 2014-05-04 NOTE — Telephone Encounter (Signed)
I have spoken with Mrs. Jasmine Kelley and the pharmacists at [facility].  I have instructed them to change her Lovenox injections to 1mg /kg BID through Monday (60mg  BID).  Coumadin has also been changed to 2mg  take 2 tablets on Friday and Sat and then 1 tablet daily.  I have discussed this with the patient as well.  She has been instructed to go to the [facility] urgent care on Sunday for a repeat INR which will be run stat and called to the on call Cardiologist Dr. Johnsie Cancel.

## 2014-05-04 NOTE — ED Notes (Signed)
Contacted by family member that prescription for Ativan not included in discharge papers.  Ativan 0.5mg  PO Q6hours PRN for anxiety, #15 called to [facility] [address] [phone]

## 2014-05-04 NOTE — Telephone Encounter (Signed)
ER called last PM to inquire about patient's coumadin prescription.  Apparently the patient presented to the ER with complaints of diarrhea, abdominal pain when eating and weakness as well as nausea and gas.  Patient had abdominal US and then prior to d/c home told the MD that she needed a prescription for coumadin because she ran out 3 days prior.  Her INR was checked which was subtherapeutic.  I recommended that the ER MD get a Case Management consult and pharmacy to dose SQ Lovenox and refill coumadin prescription.  I recommended 1mg /kg BID of Lovenox over the weekend with an INR check scheduled at hospital on Sunday and results call to on call Cardiologist.  After reviewing the chart today, it appears that the patient was given Coumadin 5mg  in the ER and started on Lovenox 90mg  daily SQ for 4 days.  She was instructed to continue her regular coumadin dose of 2mg  daily starting today. I have discussed this with the pharmacist today and recommend that patient be changed to 60mg  BID SQ Lovenox.  I have attempted to get in touch with patient and her daughter and left a message on her daughters phone.

## 2014-05-06 ENCOUNTER — Emergency Department (HOSPITAL_COMMUNITY)
Admission: EM | Admit: 2014-05-06 | Discharge: 2014-05-06 | Disposition: A | Discharge status: Home / Self Care | Payer: Medicare Other | Attending: Emergency Medicine | Admitting: Emergency Medicine

## 2014-05-06 ENCOUNTER — Encounter (HOSPITAL_COMMUNITY): Payer: Self-pay | Admitting: *Deleted

## 2014-05-06 DIAGNOSIS — F419 Anxiety disorder, unspecified: Secondary | ICD-10-CM | POA: Insufficient documentation

## 2014-05-06 DIAGNOSIS — R7989 Other specified abnormal findings of blood chemistry: Secondary | ICD-10-CM | POA: Diagnosis present

## 2014-05-06 DIAGNOSIS — M810 Age-related osteoporosis without current pathological fracture: Secondary | ICD-10-CM | POA: Diagnosis not present

## 2014-05-06 DIAGNOSIS — E039 Hypothyroidism, unspecified: Secondary | ICD-10-CM | POA: Diagnosis not present

## 2014-05-06 DIAGNOSIS — Z5181 Encounter for therapeutic drug level monitoring: Secondary | ICD-10-CM

## 2014-05-06 DIAGNOSIS — Z8719 Personal history of other diseases of the digestive system: Secondary | ICD-10-CM | POA: Diagnosis not present

## 2014-05-06 DIAGNOSIS — R791 Abnormal coagulation profile: Secondary | ICD-10-CM | POA: Insufficient documentation

## 2014-05-06 DIAGNOSIS — Z8619 Personal history of other infectious and parasitic diseases: Secondary | ICD-10-CM | POA: Insufficient documentation

## 2014-05-06 DIAGNOSIS — Z8669 Personal history of other diseases of the nervous system and sense organs: Secondary | ICD-10-CM | POA: Diagnosis not present

## 2014-05-06 DIAGNOSIS — D68318 Other hemorrhagic disorder due to intrinsic circulating anticoagulants, antibodies, or inhibitors: Secondary | ICD-10-CM | POA: Diagnosis not present

## 2014-05-06 DIAGNOSIS — Z8673 Personal history of transient ischemic attack (TIA), and cerebral infarction without residual deficits: Secondary | ICD-10-CM | POA: Insufficient documentation

## 2014-05-06 DIAGNOSIS — I10 Essential (primary) hypertension: Secondary | ICD-10-CM | POA: Insufficient documentation

## 2014-05-06 DIAGNOSIS — Z79899 Other long term (current) drug therapy: Secondary | ICD-10-CM | POA: Diagnosis not present

## 2014-05-06 DIAGNOSIS — Z7901 Long term (current) use of anticoagulants: Secondary | ICD-10-CM | POA: Diagnosis not present

## 2014-05-06 LAB — PROTIME-INR
INR: 1.89 — ABNORMAL HIGH (ref 0.00–1.49)
Prothrombin Time: 21.8 seconds — ABNORMAL HIGH (ref 11.6–15.2)

## 2014-05-06 NOTE — ED Provider Notes (Signed)
CSN: [id number]     Arrival date & time 05/06/14  1305 History  This chart was scribed for non-physician practitioner, Irena Cords, PA-C, working with Blanchie Dessert, MD, by Stephania Fragmin, ED Scribe. This patient was seen in room TR05C/TR05C and the patient's care was started at 3:02 PM.    Chief Complaint  Patient presents with  . Labs Only    The history is provided by the patient and a relative. No language interpreter was used.     HPI Comments: Jasmine Kelley is a 69 y.o. female with a history of mechanical mitral valve replacement who presents to the Emergency Department for lab work. Per family, PCP refused to refill her Coumadin prescription last week and patient went several days without it. Per daughter, her INR was 1.6 measured 3 days ago. She was given Lovenox at the ED. Patient states she is typically on 2 mg Coumadin a day, and PCP says her goal is to be between 2-3 on INR. About 4 years ago, she had internal bleeding due to a high dose of Coumadin.     Past Medical History  Diagnosis Date  . Rectus sheath hematoma     2005  . S/P MVR (mitral valve replacement)     mechanical, due to Mitral Stenosis  . Chronic anticoagulation     coumadin, AFIB  . A-fib   . Major depression   . Pulmonary HTN   . PVD (peripheral vascular disease)   . Dermatitis   . Bell's palsy   . Anxiety   . Hypothyroidism   . Allergic rhinitis   . Left shoulder pain   . Cerebellar stroke, acute     11/11/09  . 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 CVA (cerebral infarction)   . Osteoporosis     BMD 2013  . GI bleed    History reviewed. No pertinent past surgical history. Family History  Problem Relation Age of Onset  . Breast cancer Mother   . CVA Father   . Diabetes Father    History  Substance Use Topics  . Smoking status: Never Smoker   . Smokeless tobacco: Not on file  . Alcohol Use: Yes   OB History    No data available     Review of Systems  All other  systems reviewed and are negative.  A complete 10 system review of systems was obtained and all systems are negative except as noted in the HPI and PMH.     Allergies  Bee venom; Benadryl; Chlorpromazine hcl; and Peanut butter flavor  Home Medications   Prior to Admission medications   Medication Sig Start Date End Date Taking? Authorizing Provider  enoxaparin (LOVENOX) 100 MG/ML injection Inject 0.9 mLs (90 mg total) into the skin daily. 05/04/14 05/07/14  Charlesetta Shanks, MD  ferrous fumarate (HEMOCYTE - 106 MG FE) 325 (106 FE) MG TABS tablet Take 1 tablet by mouth daily.    Historical Provider, MD  LORazepam (ATIVAN) 1 MG tablet Take 0.5 tablets (0.5 mg total) by mouth every 6 (six) hours as needed for anxiety. 05/03/14   Charlesetta Shanks, MD  Omega-3 Fatty Acids (FISH OIL) 1000 MG CAPS Take 2 capsules (2,000 mg total) by mouth daily. 12/14/13   Jettie Booze, MD  phenazopyridine (PYRIDIUM) 200 MG tablet Take 1 tablet (200 mg total) by mouth 3 (three) times daily as needed for pain. Patient not taking: Reported on 05/03/2014 10/29/13   Monia Sabal  Jake Michaelis, MD  simvastatin (ZOCOR) 10 MG tablet Take 10 mg by mouth daily at 6 PM.  10/17/13   Historical Provider, MD  SYNTHROID 88 MCG tablet Take 88 mcg by mouth daily before breakfast.  10/28/13   Historical Provider, MD  valACYclovir (VALTREX) 500 MG tablet Take 500 mg by mouth daily as needed (blisters).  10/28/13   Historical Provider, MD  Vitamin D, Cholecalciferol, 1000 UNITS TABS Take 1 tablet by mouth daily.    Historical Provider, MD  warfarin (COUMADIN) 2 MG tablet 1 TABLET EVERY DAY EXCEPT 2 TABLETS ON WEDNESDAY OR AS DIRECTED BY COUMADIN CLINIC Patient not taking: Reported on 05/03/2014 02/14/14   Jettie Booze, MD  warfarin (COUMADIN) 2 MG tablet Take 2-4 mg by mouth daily. Take 2mg  daily EXCEPT Take 4mg  on Wednesday    Historical Provider, MD  warfarin (COUMADIN) 2 MG tablet Take 1 tablet (2 mg total) by mouth daily. 05/03/14   Charlesetta Shanks, MD  zinc gluconate 50 MG tablet Take 50 mg by mouth daily.    Historical Provider, MD   BP 152/95 mmHg  Pulse 78  Temp(Src) 97.6 F (36.4 C) (Oral)  Resp 16  SpO2 100% Physical Exam  Constitutional: She is oriented to person, place, and time. She appears well-developed and well-nourished. No distress.  HENT:  Head: Normocephalic and atraumatic.  Eyes: Pupils are equal, round, and reactive to light.  Cardiovascular: Normal rate.   Pulmonary/Chest: Effort normal. No respiratory distress.  Musculoskeletal: Normal range of motion.  Neurological: She is alert and oriented to person, place, and time.  Skin: Skin is warm and dry.  Psychiatric: She has a normal mood and affect. Her behavior is normal.  Nursing note and vitals reviewed.   ED Course  Procedures (including critical care time)  DIAGNOSTIC STUDIES: Oxygen Saturation is 100% on room air, normal by my interpretation.    COORDINATION OF CARE: 3:11 PM - Discussed treatment plan with pt at bedside which includes 5 mg Coumadin for the next several days to increase her INR levels and pt agreed to plan.   Labs Review Labs Reviewed  PROTIME-INR - Abnormal; Notable for the following:    Prothrombin Time 21.8 (*)    INR 1.89 (*)    All other components within normal limits     Patient is advised she will need to take 5 milligrams of Coumadin for the next 3 days and have her level rechecked.  Told to return here as needed  Brent General, PA-C 05/06/14 1518  Blanchie Dessert, MD 05/07/14 [phone]

## 2014-05-06 NOTE — ED Notes (Signed)
Pt takes coumadin. Was told by dr turners office to come to [facility] today for repeat INR and then they called today and told her to come to ED instead. No acute distress and no complaints.

## 2014-05-06 NOTE — Discharge Instructions (Signed)
Return here as needed.  Take 5 mg of Coumadin once a day for the next 3 days and have level rechecked

## 2014-05-06 NOTE — Telephone Encounter (Signed)
INR was 1.89 ER told her to continue lovenox and coumadin and recheck INR with primary  Altha Harm make sure she gets INR checked Tuesday

## 2014-05-06 NOTE — ED Notes (Signed)
Pt to ED for further evaluation regarding coumadin levels at [facility]. Pt denies cp, shortness or breath, or other symptoms

## 2014-05-07 NOTE — Telephone Encounter (Signed)
Just FYI.

## 2014-05-07 NOTE — Telephone Encounter (Signed)
Spoke with pt. We have scheduled her for an appt on Tuesday but pt unsure if she can make it or not.  I explained to pt that she will need to stay on Lovenox until we can check her INR.  Reviewed pt's refill history.  In October, she was given a prescription with 3 refills (total of 4 months).  This should have lasted her October, November, December and January.  I am not sure that she missed doses because we would not refill prescription versus she did not pick it up on time.  There are no refill requests from the pharmacy that were denied.

## 2014-05-07 NOTE — Telephone Encounter (Signed)
FYI please review and make sure this is addressed.  She has not been to out clinic since November and ran out of her coumadin - she says she was told she could not have another refill until seen and she has a mechanical MVR

## 2014-05-08 ENCOUNTER — Ambulatory Visit (INDEPENDENT_AMBULATORY_CARE_PROVIDER_SITE_OTHER): Payer: Medicare Other | Admitting: *Deleted

## 2014-05-08 DIAGNOSIS — Z7901 Long term (current) use of anticoagulants: Secondary | ICD-10-CM

## 2014-05-08 DIAGNOSIS — Z954 Presence of other heart-valve replacement: Secondary | ICD-10-CM | POA: Diagnosis not present

## 2014-05-08 DIAGNOSIS — I059 Rheumatic mitral valve disease, unspecified: Secondary | ICD-10-CM

## 2014-05-08 DIAGNOSIS — Z952 Presence of prosthetic heart valve: Secondary | ICD-10-CM

## 2014-05-08 LAB — POCT INR: INR: 4.8

## 2014-05-15 ENCOUNTER — Ambulatory Visit (INDEPENDENT_AMBULATORY_CARE_PROVIDER_SITE_OTHER): Payer: Medicare Other | Admitting: *Deleted

## 2014-05-15 DIAGNOSIS — I059 Rheumatic mitral valve disease, unspecified: Secondary | ICD-10-CM

## 2014-05-15 DIAGNOSIS — Z952 Presence of prosthetic heart valve: Secondary | ICD-10-CM

## 2014-05-15 DIAGNOSIS — Z7901 Long term (current) use of anticoagulants: Secondary | ICD-10-CM

## 2014-05-15 DIAGNOSIS — Z954 Presence of other heart-valve replacement: Secondary | ICD-10-CM

## 2014-05-15 LAB — POCT INR: INR: 2.3

## 2014-06-06 ENCOUNTER — Ambulatory Visit (INDEPENDENT_AMBULATORY_CARE_PROVIDER_SITE_OTHER): Payer: Medicare Other | Admitting: *Deleted

## 2014-06-06 ENCOUNTER — Ambulatory Visit
Admission: RE | Admit: 2014-06-06 | Discharge: 2014-06-06 | Disposition: A | Discharge status: Home / Self Care | Payer: Medicare Other | Source: Ambulatory Visit | Attending: Internal Medicine | Admitting: Internal Medicine

## 2014-06-06 DIAGNOSIS — Z952 Presence of prosthetic heart valve: Secondary | ICD-10-CM

## 2014-06-06 DIAGNOSIS — Z954 Presence of other heart-valve replacement: Secondary | ICD-10-CM

## 2014-06-06 DIAGNOSIS — Z1231 Encounter for screening mammogram for malignant neoplasm of breast: Secondary | ICD-10-CM | POA: Diagnosis not present

## 2014-06-06 DIAGNOSIS — Z7901 Long term (current) use of anticoagulants: Secondary | ICD-10-CM

## 2014-06-06 DIAGNOSIS — I059 Rheumatic mitral valve disease, unspecified: Secondary | ICD-10-CM

## 2014-06-06 LAB — POCT INR: INR: 2.5

## 2014-06-07 ENCOUNTER — Other Ambulatory Visit: Payer: Self-pay | Admitting: Internal Medicine

## 2014-06-07 DIAGNOSIS — R928 Other abnormal and inconclusive findings on diagnostic imaging of breast: Secondary | ICD-10-CM

## 2014-06-20 ENCOUNTER — Other Ambulatory Visit: Payer: Medicare Other

## 2014-07-04 ENCOUNTER — Ambulatory Visit (INDEPENDENT_AMBULATORY_CARE_PROVIDER_SITE_OTHER): Payer: Medicare Other | Admitting: *Deleted

## 2014-07-04 DIAGNOSIS — Z954 Presence of other heart-valve replacement: Secondary | ICD-10-CM | POA: Diagnosis not present

## 2014-07-04 DIAGNOSIS — I059 Rheumatic mitral valve disease, unspecified: Secondary | ICD-10-CM

## 2014-07-04 DIAGNOSIS — Z952 Presence of prosthetic heart valve: Secondary | ICD-10-CM

## 2014-07-04 DIAGNOSIS — Z7901 Long term (current) use of anticoagulants: Secondary | ICD-10-CM | POA: Diagnosis not present

## 2014-07-04 LAB — POCT INR: INR: 2.3

## 2014-08-01 ENCOUNTER — Ambulatory Visit (INDEPENDENT_AMBULATORY_CARE_PROVIDER_SITE_OTHER): Payer: Medicare Other | Admitting: *Deleted

## 2014-08-01 DIAGNOSIS — Z952 Presence of prosthetic heart valve: Secondary | ICD-10-CM

## 2014-08-01 DIAGNOSIS — I059 Rheumatic mitral valve disease, unspecified: Secondary | ICD-10-CM | POA: Diagnosis not present

## 2014-08-01 DIAGNOSIS — Z954 Presence of other heart-valve replacement: Secondary | ICD-10-CM | POA: Diagnosis not present

## 2014-08-01 DIAGNOSIS — Z7901 Long term (current) use of anticoagulants: Secondary | ICD-10-CM

## 2014-08-01 LAB — POCT INR: INR: 2.4

## 2014-08-16 ENCOUNTER — Other Ambulatory Visit: Payer: Self-pay | Admitting: *Deleted

## 2014-08-16 MED ORDER — WARFARIN SODIUM 2 MG PO TABS: ORAL_TABLET | ORAL | Status: DC

## 2014-08-29 ENCOUNTER — Ambulatory Visit (INDEPENDENT_AMBULATORY_CARE_PROVIDER_SITE_OTHER): Payer: Medicare Other

## 2014-08-29 DIAGNOSIS — I059 Rheumatic mitral valve disease, unspecified: Secondary | ICD-10-CM | POA: Diagnosis not present

## 2014-08-29 DIAGNOSIS — Z7901 Long term (current) use of anticoagulants: Secondary | ICD-10-CM

## 2014-08-29 DIAGNOSIS — Z954 Presence of other heart-valve replacement: Secondary | ICD-10-CM | POA: Diagnosis not present

## 2014-08-29 DIAGNOSIS — Z952 Presence of prosthetic heart valve: Secondary | ICD-10-CM

## 2014-08-29 LAB — POCT INR: INR: 1.9

## 2014-11-08 DIAGNOSIS — M810 Age-related osteoporosis without current pathological fracture: Secondary | ICD-10-CM | POA: Diagnosis not present

## 2014-11-08 DIAGNOSIS — N8110 Cystocele, unspecified: Secondary | ICD-10-CM | POA: Diagnosis not present

## 2014-11-08 DIAGNOSIS — F322 Major depressive disorder, single episode, severe without psychotic features: Secondary | ICD-10-CM | POA: Diagnosis not present

## 2014-11-08 DIAGNOSIS — I270 Primary pulmonary hypertension: Secondary | ICD-10-CM | POA: Diagnosis not present

## 2014-11-08 DIAGNOSIS — I739 Peripheral vascular disease, unspecified: Secondary | ICD-10-CM | POA: Diagnosis not present

## 2014-11-08 DIAGNOSIS — E782 Mixed hyperlipidemia: Secondary | ICD-10-CM | POA: Diagnosis not present

## 2014-11-08 DIAGNOSIS — I639 Cerebral infarction, unspecified: Secondary | ICD-10-CM | POA: Diagnosis not present

## 2014-11-08 DIAGNOSIS — I482 Chronic atrial fibrillation: Secondary | ICD-10-CM | POA: Diagnosis not present

## 2014-11-08 DIAGNOSIS — E039 Hypothyroidism, unspecified: Secondary | ICD-10-CM | POA: Diagnosis not present

## 2014-11-08 DIAGNOSIS — J309 Allergic rhinitis, unspecified: Secondary | ICD-10-CM | POA: Diagnosis not present

## 2014-11-08 DIAGNOSIS — F419 Anxiety disorder, unspecified: Secondary | ICD-10-CM | POA: Diagnosis not present

## 2014-11-14 ENCOUNTER — Ambulatory Visit (INDEPENDENT_AMBULATORY_CARE_PROVIDER_SITE_OTHER): Payer: Medicare Other | Admitting: *Deleted

## 2014-11-14 DIAGNOSIS — I059 Rheumatic mitral valve disease, unspecified: Secondary | ICD-10-CM | POA: Diagnosis not present

## 2014-11-14 DIAGNOSIS — Z7901 Long term (current) use of anticoagulants: Secondary | ICD-10-CM

## 2014-11-14 DIAGNOSIS — Z954 Presence of other heart-valve replacement: Secondary | ICD-10-CM

## 2014-11-14 DIAGNOSIS — Z952 Presence of prosthetic heart valve: Secondary | ICD-10-CM

## 2014-11-14 LAB — POCT INR: INR: 3.8

## 2014-11-26 DIAGNOSIS — M810 Age-related osteoporosis without current pathological fracture: Secondary | ICD-10-CM | POA: Diagnosis not present

## 2014-12-06 ENCOUNTER — Ambulatory Visit (INDEPENDENT_AMBULATORY_CARE_PROVIDER_SITE_OTHER): Payer: Medicare Other | Admitting: *Deleted

## 2014-12-06 DIAGNOSIS — Z954 Presence of other heart-valve replacement: Secondary | ICD-10-CM | POA: Diagnosis not present

## 2014-12-06 DIAGNOSIS — Z952 Presence of prosthetic heart valve: Secondary | ICD-10-CM

## 2014-12-06 DIAGNOSIS — Z7901 Long term (current) use of anticoagulants: Secondary | ICD-10-CM | POA: Diagnosis not present

## 2014-12-06 DIAGNOSIS — N3946 Mixed incontinence: Secondary | ICD-10-CM | POA: Diagnosis not present

## 2014-12-06 DIAGNOSIS — I059 Rheumatic mitral valve disease, unspecified: Secondary | ICD-10-CM

## 2014-12-06 DIAGNOSIS — R351 Nocturia: Secondary | ICD-10-CM | POA: Diagnosis not present

## 2014-12-06 LAB — POCT INR: INR: 3.5

## 2014-12-10 DIAGNOSIS — N3946 Mixed incontinence: Secondary | ICD-10-CM | POA: Diagnosis not present

## 2015-01-02 DIAGNOSIS — N3946 Mixed incontinence: Secondary | ICD-10-CM | POA: Diagnosis not present

## 2015-01-02 DIAGNOSIS — N3020 Other chronic cystitis without hematuria: Secondary | ICD-10-CM | POA: Diagnosis not present

## 2015-01-24 ENCOUNTER — Ambulatory Visit (INDEPENDENT_AMBULATORY_CARE_PROVIDER_SITE_OTHER): Payer: Medicare Other | Admitting: *Deleted

## 2015-01-24 DIAGNOSIS — Z7901 Long term (current) use of anticoagulants: Secondary | ICD-10-CM

## 2015-01-24 DIAGNOSIS — I059 Rheumatic mitral valve disease, unspecified: Secondary | ICD-10-CM | POA: Diagnosis not present

## 2015-01-24 DIAGNOSIS — Z952 Presence of prosthetic heart valve: Secondary | ICD-10-CM

## 2015-01-24 DIAGNOSIS — Z954 Presence of other heart-valve replacement: Secondary | ICD-10-CM | POA: Diagnosis not present

## 2015-01-24 LAB — POCT INR: INR: 3.3

## 2015-02-06 ENCOUNTER — Ambulatory Visit (INDEPENDENT_AMBULATORY_CARE_PROVIDER_SITE_OTHER): Payer: Medicare Other | Admitting: *Deleted

## 2015-02-06 DIAGNOSIS — Z7901 Long term (current) use of anticoagulants: Secondary | ICD-10-CM

## 2015-02-06 DIAGNOSIS — Z952 Presence of prosthetic heart valve: Secondary | ICD-10-CM

## 2015-02-06 DIAGNOSIS — Z954 Presence of other heart-valve replacement: Secondary | ICD-10-CM | POA: Diagnosis not present

## 2015-02-06 DIAGNOSIS — I059 Rheumatic mitral valve disease, unspecified: Secondary | ICD-10-CM | POA: Diagnosis not present

## 2015-02-06 LAB — POCT INR: INR: 1.5

## 2015-02-10 ENCOUNTER — Other Ambulatory Visit: Payer: Self-pay | Admitting: Interventional Cardiology

## 2015-02-11 NOTE — Telephone Encounter (Signed)
Pt requesting refill

## 2015-02-13 ENCOUNTER — Ambulatory Visit (INDEPENDENT_AMBULATORY_CARE_PROVIDER_SITE_OTHER): Payer: Medicare Other | Admitting: *Deleted

## 2015-02-13 DIAGNOSIS — I059 Rheumatic mitral valve disease, unspecified: Secondary | ICD-10-CM | POA: Diagnosis not present

## 2015-02-13 DIAGNOSIS — Z7901 Long term (current) use of anticoagulants: Secondary | ICD-10-CM

## 2015-02-13 DIAGNOSIS — Z954 Presence of other heart-valve replacement: Secondary | ICD-10-CM | POA: Diagnosis not present

## 2015-02-13 DIAGNOSIS — Z952 Presence of prosthetic heart valve: Secondary | ICD-10-CM

## 2015-02-13 LAB — POCT INR: INR: 2.3

## 2015-02-27 ENCOUNTER — Ambulatory Visit: Payer: Medicare Other | Admitting: Interventional Cardiology

## 2015-03-07 ENCOUNTER — Encounter: Payer: Self-pay | Admitting: Interventional Cardiology

## 2015-03-07 ENCOUNTER — Ambulatory Visit (INDEPENDENT_AMBULATORY_CARE_PROVIDER_SITE_OTHER): Payer: Medicare Other | Admitting: Interventional Cardiology

## 2015-03-07 ENCOUNTER — Ambulatory Visit (INDEPENDENT_AMBULATORY_CARE_PROVIDER_SITE_OTHER): Payer: Medicare Other

## 2015-03-07 VITALS — BP 140/80 | HR 81 | Ht 69.0 in | Wt 140.0 lb

## 2015-03-07 DIAGNOSIS — I059 Rheumatic mitral valve disease, unspecified: Secondary | ICD-10-CM

## 2015-03-07 DIAGNOSIS — I480 Paroxysmal atrial fibrillation: Secondary | ICD-10-CM

## 2015-03-07 DIAGNOSIS — Z954 Presence of other heart-valve replacement: Secondary | ICD-10-CM

## 2015-03-07 DIAGNOSIS — E782 Mixed hyperlipidemia: Secondary | ICD-10-CM

## 2015-03-07 DIAGNOSIS — Z952 Presence of prosthetic heart valve: Secondary | ICD-10-CM

## 2015-03-07 DIAGNOSIS — Z7901 Long term (current) use of anticoagulants: Secondary | ICD-10-CM

## 2015-03-07 LAB — POCT INR: INR: 2.1

## 2015-03-07 NOTE — Patient Instructions (Signed)
Medication Instructions:  None  Labwork: None  Testing/Procedures: None  Follow-Up: Your physician wants you to follow-up in: 1 year with Dr. Varanasi.  You will receive a reminder letter in the mail two months in advance. If you don't receive a letter, please call our office to schedule the follow-up appointment.   Any Other Special Instructions Will Be Listed Below (If Applicable).     If you need a refill on your cardiac medications before your next appointment, please call your pharmacy.   

## 2015-03-07 NOTE — Progress Notes (Signed)
Patient ID: Jasmine Kelley, female   DOB: 1946/04/12, 69 y.o.   MRN: [id number]     Cardiology Office Note   Date:  03/07/2015   ID:  Jasmine, Kelley [birth year], MRN [id number]  PCP:  Jasmine Low, MD    No chief complaint on file. s/p MVR   Wt Readings from Last 3 Encounters:  03/07/15 140 lb (63.504 kg)  12/08/13 132 lb (59.875 kg)  04/16/08 142 lb 2.1 oz (64.47 kg)       History of Present Illness: Jasmine Kelley is a 69 y.o. female  who has a history of a mechanical mitral valve replacement many years ago. I saw her in 2011 prior to shoulder surgery. She presents today for routine annual followup. Overall, she has been feeling fairly well. She does not report any chest pain. She has some mild shortness of breath with significant exertion, but this is unchanged, particularly going up hill. She has not had any significant bleeding problems.  She feels anxiety is worse.  She walks on her 1 acre of land.  She feels well with this.  Anxiety causes some SHOB.  Occasional LE edema, improved with leg elevation.     Past Medical History  Diagnosis Date  . Rectus sheath hematoma     2005  . S/P MVR (mitral valve replacement)     mechanical, due to Mitral Stenosis  . Chronic anticoagulation     coumadin, AFIB  . A-fib ([name])   . Major depression ([name])   . Pulmonary HTN ([name])   . PVD (peripheral vascular disease) ([name])   . Dermatitis   . Bell's palsy   . Anxiety   . Hypothyroidism   . Allergic rhinitis   . Left shoulder pain   . Cerebellar stroke, acute ([name])     11/11/09  . 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 CVA (cerebral infarction)   . Osteoporosis     BMD 2013  . GI bleed     History reviewed. No pertinent past surgical history.   Current Outpatient Prescriptions  Medication Sig Dispense Refill  . alendronate (FOSAMAX) 70 MG tablet Take 70 mg by mouth once a week.  5  . DULoxetine (CYMBALTA) 60 MG capsule Take 60 mg by mouth daily.      . ferrous fumarate (HEMOCYTE - 106 MG FE) 325 (106 FE) MG TABS tablet Take 1 tablet by mouth daily.    Marland Kitchen LORazepam (ATIVAN) 1 MG tablet Take 0.5 tablets (0.5 mg total) by mouth every 6 (six) hours as needed for anxiety. 15 tablet 0  . Omega-3 Fatty Acids (FISH OIL) 1000 MG CAPS Take 2 capsules (2,000 mg total) by mouth daily.  0  . simvastatin (ZOCOR) 10 MG tablet Take 10 mg by mouth daily at 6 PM.     . SYNTHROID 88 MCG tablet Take 88 mcg by mouth daily before breakfast.     . valACYclovir (VALTREX) 500 MG tablet Take 500 mg by mouth daily as needed (blisters).     . Vitamin D, Cholecalciferol, 1000 UNITS TABS Take 1 tablet by mouth daily.    Marland Kitchen warfarin (COUMADIN) 2 MG tablet TAKE AS DIRECTED BY COUMADIN CLINIC 30 tablet 1   No current facility-administered medications for this visit.    Allergies:   Bee venom; Benadryl; Buspirone; Chlorpromazine hcl; and Peanut butter flavor    Social History:  The patient  reports that she has never smoked. She does not  have any smokeless tobacco history on file. She reports that she drinks alcohol. She reports that she does not use illicit drugs.   Family History:  The patient's family history includes Breast cancer in her mother; CVA in her father; Diabetes in her father; Heart attack in her father; Stroke in her father. There is no history of Hypertension.    ROS:  Please see the history of present illness.   Otherwise, review of systems are positive for leg swelling, knee pain.   All other systems are reviewed and negative.    PHYSICAL EXAM: VS:  BP 140/80 mmHg  Pulse 81  Ht 5\' 9"  (1.753 m)  Wt 140 lb (63.504 kg)  BMI 20.67 kg/m2 , BMI Body mass index is 20.67 kg/(m^2). GEN: Well nourished, well developed, in no acute distress HEENT: normal Neck: no JVD, carotid bruits, or masses Cardiac: RRR; crisp S1 , no murmurs, rubs, or gallops,no edema  Respiratory:  clear to auscultation bilaterally, normal work of breathing GI: soft, nontender,  nondistended, + BS MS: no deformity or atrophy Skin: warm and dry, no rash Neuro:  Strength and sensation are intact Psych: euthymic mood, full affect   EKG:   The ekg ordered today demonstrates normal   Recent Labs: 05/03/2014: ALT 203*; BUN 13; Creatinine, Ser 0.85; Hemoglobin 15.6*; Platelets 300; Potassium 4.3; Sodium 137   Lipid Panel    Component Value Date/Time   CHOL  11/24/2009 2222    180        ATP III CLASSIFICATION:  <200     mg/dL   Desirable  200-239  mg/dL   Borderline High  >=240    mg/dL   High          TRIG 268* 11/24/2009 2222   HDL 35* 11/24/2009 2222   CHOLHDL 5.1 11/24/2009 2222   VLDL 54* 11/24/2009 2222   LDLCALC  11/24/2009 2222    91        Total Cholesterol/HDL:CHD Risk Coronary Heart Disease Risk Table                     Men   Women  1/2 Average Risk   3.4   3.3  Average Risk       5.0   4.4  2 X Average Risk   9.6   7.1  3 X Average Risk  23.4   11.0        Use the calculated Patient Ratio above and the CHD Risk Table to determine the patient's CHD Risk.        ATP III CLASSIFICATION (LDL):  <100     mg/dL   Optimal  100-129  mg/dL   Near or Above                    Optimal  130-159  mg/dL   Borderline  160-189  mg/dL   High  >190     mg/dL   Very High     Other studies Reviewed: Additional studies/ records that were reviewed today with results demonstrating: .   ASSESSMENT AND PLAN:  1. S/p mechanical mitral valve: Coumadin to prevent valve thrombosis.  I encouraged her to follow instructions strictly and not to skip doses.  No bleeding problems.     2. Hypertriglyceridemia:  Folowed by PMD. 3. H/o AFib: Maintaining NSR at this time. Coumadin for stroke prevention.   Current medicines are reviewed at length with the patient today.  The patient  concerns regarding her medicines were addressed.  The following changes have been made:  No change  Labs/ tests ordered today include: INR check No orders of the defined types  were placed in this encounter.    Recommend 150 minutes/week of aerobic exercise Kelley fat, Kelley carb, high fiber diet recommended  Disposition:   FU in  1 year   Teresita Madura., MD  03/07/2015 4:23 PM    [facility] [address], [place], [place]  [address] Phone: [phone]; Fax: [phone]

## 2015-03-08 DIAGNOSIS — I482 Chronic atrial fibrillation: Secondary | ICD-10-CM | POA: Diagnosis not present

## 2015-03-08 DIAGNOSIS — F419 Anxiety disorder, unspecified: Secondary | ICD-10-CM | POA: Diagnosis not present

## 2015-03-08 DIAGNOSIS — I739 Peripheral vascular disease, unspecified: Secondary | ICD-10-CM | POA: Diagnosis not present

## 2015-03-08 DIAGNOSIS — M810 Age-related osteoporosis without current pathological fracture: Secondary | ICD-10-CM | POA: Diagnosis not present

## 2015-03-08 DIAGNOSIS — E7800 Pure hypercholesterolemia, unspecified: Secondary | ICD-10-CM | POA: Diagnosis not present

## 2015-03-08 DIAGNOSIS — Z1389 Encounter for screening for other disorder: Secondary | ICD-10-CM | POA: Diagnosis not present

## 2015-03-08 DIAGNOSIS — I270 Primary pulmonary hypertension: Secondary | ICD-10-CM | POA: Diagnosis not present

## 2015-03-08 DIAGNOSIS — E039 Hypothyroidism, unspecified: Secondary | ICD-10-CM | POA: Diagnosis not present

## 2015-03-08 DIAGNOSIS — F322 Major depressive disorder, single episode, severe without psychotic features: Secondary | ICD-10-CM | POA: Diagnosis not present

## 2015-03-08 DIAGNOSIS — Z23 Encounter for immunization: Secondary | ICD-10-CM | POA: Diagnosis not present

## 2015-04-10 ENCOUNTER — Ambulatory Visit (INDEPENDENT_AMBULATORY_CARE_PROVIDER_SITE_OTHER): Payer: Medicare Other | Admitting: *Deleted

## 2015-04-10 DIAGNOSIS — I059 Rheumatic mitral valve disease, unspecified: Secondary | ICD-10-CM

## 2015-04-10 DIAGNOSIS — Z7901 Long term (current) use of anticoagulants: Secondary | ICD-10-CM | POA: Diagnosis not present

## 2015-04-10 LAB — POCT INR: INR: 2.4

## 2015-04-14 ENCOUNTER — Other Ambulatory Visit: Payer: Self-pay | Admitting: Interventional Cardiology

## 2015-04-18 ENCOUNTER — Telehealth: Payer: Self-pay | Admitting: *Deleted

## 2015-04-18 NOTE — Telephone Encounter (Signed)
Pt called stating her daughter picked up her coumadin refill on Dec 6th and that they have lost the bottle of coumadin Pt instructed that she does have refill on the coumadin and to return to Pharmacy and ask for the refill and pt instructed may have to pay for the coumadin and she is instructed to call back with any problems and she states understanding

## 2015-05-24 ENCOUNTER — Other Ambulatory Visit: Payer: Self-pay | Admitting: Interventional Cardiology

## 2015-05-28 ENCOUNTER — Ambulatory Visit (INDEPENDENT_AMBULATORY_CARE_PROVIDER_SITE_OTHER): Payer: Medicare Other | Admitting: Pharmacist

## 2015-05-28 DIAGNOSIS — Z7901 Long term (current) use of anticoagulants: Secondary | ICD-10-CM

## 2015-05-28 DIAGNOSIS — I059 Rheumatic mitral valve disease, unspecified: Secondary | ICD-10-CM | POA: Diagnosis not present

## 2015-05-28 LAB — POCT INR: INR: 2.1

## 2015-05-28 MED ORDER — WARFARIN SODIUM 2 MG PO TABS: ORAL_TABLET | ORAL | Status: DC

## 2015-06-19 ENCOUNTER — Ambulatory Visit (INDEPENDENT_AMBULATORY_CARE_PROVIDER_SITE_OTHER): Payer: Medicare Other | Admitting: *Deleted

## 2015-06-19 DIAGNOSIS — Z7901 Long term (current) use of anticoagulants: Secondary | ICD-10-CM

## 2015-06-19 DIAGNOSIS — I059 Rheumatic mitral valve disease, unspecified: Secondary | ICD-10-CM | POA: Diagnosis not present

## 2015-06-19 LAB — POCT INR: INR: 3.1

## 2015-06-21 DIAGNOSIS — H40033 Anatomical narrow angle, bilateral: Secondary | ICD-10-CM | POA: Diagnosis not present

## 2015-06-21 DIAGNOSIS — H04123 Dry eye syndrome of bilateral lacrimal glands: Secondary | ICD-10-CM | POA: Diagnosis not present

## 2015-07-03 ENCOUNTER — Ambulatory Visit (INDEPENDENT_AMBULATORY_CARE_PROVIDER_SITE_OTHER): Payer: Medicare Other | Admitting: *Deleted

## 2015-07-03 DIAGNOSIS — Z7901 Long term (current) use of anticoagulants: Secondary | ICD-10-CM

## 2015-07-03 DIAGNOSIS — I059 Rheumatic mitral valve disease, unspecified: Secondary | ICD-10-CM | POA: Diagnosis not present

## 2015-07-03 LAB — POCT INR: INR: 2.6

## 2015-07-05 DIAGNOSIS — M810 Age-related osteoporosis without current pathological fracture: Secondary | ICD-10-CM | POA: Diagnosis not present

## 2015-07-05 DIAGNOSIS — I270 Primary pulmonary hypertension: Secondary | ICD-10-CM | POA: Diagnosis not present

## 2015-07-05 DIAGNOSIS — E782 Mixed hyperlipidemia: Secondary | ICD-10-CM | POA: Diagnosis not present

## 2015-07-05 DIAGNOSIS — I482 Chronic atrial fibrillation: Secondary | ICD-10-CM | POA: Diagnosis not present

## 2015-07-05 DIAGNOSIS — E039 Hypothyroidism, unspecified: Secondary | ICD-10-CM | POA: Diagnosis not present

## 2015-07-05 DIAGNOSIS — F322 Major depressive disorder, single episode, severe without psychotic features: Secondary | ICD-10-CM | POA: Diagnosis not present

## 2015-07-05 DIAGNOSIS — J309 Allergic rhinitis, unspecified: Secondary | ICD-10-CM | POA: Diagnosis not present

## 2015-07-05 DIAGNOSIS — I739 Peripheral vascular disease, unspecified: Secondary | ICD-10-CM | POA: Diagnosis not present

## 2015-07-05 DIAGNOSIS — F419 Anxiety disorder, unspecified: Secondary | ICD-10-CM | POA: Diagnosis not present

## 2015-07-05 DIAGNOSIS — I639 Cerebral infarction, unspecified: Secondary | ICD-10-CM | POA: Diagnosis not present

## 2015-07-20 ENCOUNTER — Other Ambulatory Visit: Payer: Self-pay | Admitting: Interventional Cardiology

## 2015-07-24 ENCOUNTER — Ambulatory Visit (INDEPENDENT_AMBULATORY_CARE_PROVIDER_SITE_OTHER): Payer: Medicare Other | Admitting: Pharmacist

## 2015-07-24 DIAGNOSIS — Z7901 Long term (current) use of anticoagulants: Secondary | ICD-10-CM

## 2015-07-24 DIAGNOSIS — I059 Rheumatic mitral valve disease, unspecified: Secondary | ICD-10-CM

## 2015-07-24 LAB — POCT INR: INR: 1.6

## 2015-08-14 ENCOUNTER — Ambulatory Visit (INDEPENDENT_AMBULATORY_CARE_PROVIDER_SITE_OTHER): Payer: Medicare Other | Admitting: *Deleted

## 2015-08-14 DIAGNOSIS — Z7901 Long term (current) use of anticoagulants: Secondary | ICD-10-CM

## 2015-08-14 DIAGNOSIS — I059 Rheumatic mitral valve disease, unspecified: Secondary | ICD-10-CM | POA: Diagnosis not present

## 2015-08-14 LAB — POCT INR: INR: 2.2

## 2015-10-10 ENCOUNTER — Other Ambulatory Visit: Payer: Self-pay | Admitting: Interventional Cardiology

## 2015-10-11 NOTE — Telephone Encounter (Signed)
Pt calls back explained to her that her dosage was changed at last visit in April and she was do to come in in April to recheck effectiveness of new dose. Pt states she doesn't have a ride today as her daughter is out of town and she forgot last visit. She has been out of Coumadin for past 4 days, explained to her that we can only send in enough tablets to get her to appt on Wednesday for safety reasons. She verbalizes understanding.

## 2015-10-16 ENCOUNTER — Ambulatory Visit (INDEPENDENT_AMBULATORY_CARE_PROVIDER_SITE_OTHER): Payer: Medicare Other | Admitting: *Deleted

## 2015-10-16 DIAGNOSIS — Z7901 Long term (current) use of anticoagulants: Secondary | ICD-10-CM

## 2015-10-16 DIAGNOSIS — I059 Rheumatic mitral valve disease, unspecified: Secondary | ICD-10-CM | POA: Diagnosis not present

## 2015-10-16 LAB — POCT INR: INR: 1.5

## 2015-10-16 MED ORDER — WARFARIN SODIUM 2 MG PO TABS: ORAL_TABLET | ORAL | Status: DC

## 2015-10-23 ENCOUNTER — Ambulatory Visit (INDEPENDENT_AMBULATORY_CARE_PROVIDER_SITE_OTHER): Payer: Medicare Other | Admitting: *Deleted

## 2015-10-23 DIAGNOSIS — Z7901 Long term (current) use of anticoagulants: Secondary | ICD-10-CM | POA: Diagnosis not present

## 2015-10-23 DIAGNOSIS — I059 Rheumatic mitral valve disease, unspecified: Secondary | ICD-10-CM | POA: Diagnosis not present

## 2015-10-23 LAB — POCT INR: INR: 2.6

## 2015-10-23 MED ORDER — WARFARIN SODIUM 2 MG PO TABS: ORAL_TABLET | ORAL | Status: DC

## 2015-11-12 ENCOUNTER — Ambulatory Visit (INDEPENDENT_AMBULATORY_CARE_PROVIDER_SITE_OTHER): Payer: Medicare Other

## 2015-11-12 DIAGNOSIS — I059 Rheumatic mitral valve disease, unspecified: Secondary | ICD-10-CM

## 2015-11-12 DIAGNOSIS — Z7901 Long term (current) use of anticoagulants: Secondary | ICD-10-CM

## 2015-11-12 LAB — POCT INR: INR: 4.2

## 2015-12-18 DIAGNOSIS — E782 Mixed hyperlipidemia: Secondary | ICD-10-CM | POA: Diagnosis not present

## 2015-12-18 DIAGNOSIS — Z7189 Other specified counseling: Secondary | ICD-10-CM | POA: Diagnosis not present

## 2015-12-18 DIAGNOSIS — F419 Anxiety disorder, unspecified: Secondary | ICD-10-CM | POA: Diagnosis not present

## 2015-12-18 DIAGNOSIS — I482 Chronic atrial fibrillation: Secondary | ICD-10-CM | POA: Diagnosis not present

## 2015-12-18 DIAGNOSIS — J309 Allergic rhinitis, unspecified: Secondary | ICD-10-CM | POA: Diagnosis not present

## 2015-12-18 DIAGNOSIS — Z1389 Encounter for screening for other disorder: Secondary | ICD-10-CM | POA: Diagnosis not present

## 2015-12-18 DIAGNOSIS — E039 Hypothyroidism, unspecified: Secondary | ICD-10-CM | POA: Diagnosis not present

## 2015-12-18 DIAGNOSIS — I639 Cerebral infarction, unspecified: Secondary | ICD-10-CM | POA: Diagnosis not present

## 2015-12-18 DIAGNOSIS — F322 Major depressive disorder, single episode, severe without psychotic features: Secondary | ICD-10-CM | POA: Diagnosis not present

## 2015-12-18 DIAGNOSIS — M810 Age-related osteoporosis without current pathological fracture: Secondary | ICD-10-CM | POA: Diagnosis not present

## 2015-12-18 DIAGNOSIS — I270 Primary pulmonary hypertension: Secondary | ICD-10-CM | POA: Diagnosis not present

## 2015-12-18 DIAGNOSIS — I739 Peripheral vascular disease, unspecified: Secondary | ICD-10-CM | POA: Diagnosis not present

## 2015-12-18 DIAGNOSIS — Z0000 Encounter for general adult medical examination without abnormal findings: Secondary | ICD-10-CM | POA: Diagnosis not present

## 2015-12-24 ENCOUNTER — Ambulatory Visit (INDEPENDENT_AMBULATORY_CARE_PROVIDER_SITE_OTHER): Payer: Medicare Other | Admitting: *Deleted

## 2015-12-24 DIAGNOSIS — Z7901 Long term (current) use of anticoagulants: Secondary | ICD-10-CM | POA: Diagnosis not present

## 2015-12-24 DIAGNOSIS — I059 Rheumatic mitral valve disease, unspecified: Secondary | ICD-10-CM

## 2015-12-24 LAB — POCT INR: INR: 2.1

## 2015-12-25 ENCOUNTER — Other Ambulatory Visit: Payer: Self-pay | Admitting: Interventional Cardiology

## 2016-01-22 ENCOUNTER — Ambulatory Visit (INDEPENDENT_AMBULATORY_CARE_PROVIDER_SITE_OTHER): Payer: Medicare Other | Admitting: *Deleted

## 2016-01-22 DIAGNOSIS — Z7901 Long term (current) use of anticoagulants: Secondary | ICD-10-CM | POA: Diagnosis not present

## 2016-01-22 DIAGNOSIS — I059 Rheumatic mitral valve disease, unspecified: Secondary | ICD-10-CM | POA: Diagnosis not present

## 2016-01-22 LAB — POCT INR: INR: 3

## 2016-02-19 ENCOUNTER — Other Ambulatory Visit: Payer: Self-pay | Admitting: Interventional Cardiology

## 2016-02-19 ENCOUNTER — Telehealth: Payer: Self-pay | Admitting: *Deleted

## 2016-02-19 NOTE — Telephone Encounter (Signed)
Called pt to inform her that she needed to be seen in [facility] because she is overdue & requesting a refill. She stated that she would be able to come  Until next week, therefore, advised that she is 2 weeks overdue & needs to be seen soon.  She will be able to come on Friday. Also, she stated that she has no more Coumadin, I sent in enough Coumadin to get her until Friday's appt & she verbalized understanding.  She will pick up meds today so she does not miss a dose.

## 2016-02-21 ENCOUNTER — Ambulatory Visit (INDEPENDENT_AMBULATORY_CARE_PROVIDER_SITE_OTHER): Payer: Medicare Other | Admitting: *Deleted

## 2016-02-21 DIAGNOSIS — Z7901 Long term (current) use of anticoagulants: Secondary | ICD-10-CM | POA: Diagnosis not present

## 2016-02-21 DIAGNOSIS — I059 Rheumatic mitral valve disease, unspecified: Secondary | ICD-10-CM

## 2016-02-21 LAB — POCT INR: INR: 1.9

## 2016-02-21 MED ORDER — WARFARIN SODIUM 2 MG PO TABS: ORAL_TABLET | ORAL | 0 refills | Status: DC

## 2016-03-06 ENCOUNTER — Ambulatory Visit (INDEPENDENT_AMBULATORY_CARE_PROVIDER_SITE_OTHER): Payer: Medicare Other | Admitting: *Deleted

## 2016-03-06 DIAGNOSIS — Z7901 Long term (current) use of anticoagulants: Secondary | ICD-10-CM

## 2016-03-06 DIAGNOSIS — I059 Rheumatic mitral valve disease, unspecified: Secondary | ICD-10-CM

## 2016-03-06 LAB — POCT INR: INR: 1.3

## 2016-03-20 ENCOUNTER — Ambulatory Visit (INDEPENDENT_AMBULATORY_CARE_PROVIDER_SITE_OTHER): Payer: Medicare Other | Admitting: *Deleted

## 2016-03-20 DIAGNOSIS — Z7901 Long term (current) use of anticoagulants: Secondary | ICD-10-CM

## 2016-03-20 DIAGNOSIS — I059 Rheumatic mitral valve disease, unspecified: Secondary | ICD-10-CM | POA: Diagnosis not present

## 2016-03-20 LAB — POCT INR: INR: 2.3

## 2016-04-02 ENCOUNTER — Other Ambulatory Visit: Payer: Self-pay | Admitting: Interventional Cardiology

## 2016-04-23 ENCOUNTER — Ambulatory Visit (INDEPENDENT_AMBULATORY_CARE_PROVIDER_SITE_OTHER): Payer: Medicare Other | Admitting: *Deleted

## 2016-04-23 DIAGNOSIS — I059 Rheumatic mitral valve disease, unspecified: Secondary | ICD-10-CM | POA: Diagnosis not present

## 2016-04-23 DIAGNOSIS — Z7901 Long term (current) use of anticoagulants: Secondary | ICD-10-CM

## 2016-04-23 LAB — POCT INR: INR: 2.1

## 2016-05-02 IMAGING — US US ABDOMEN COMPLETE
1 series · 14 of 25 positions shown · non-contrast
Comparison: CT abdomen pelvis 07/24/2004.

CLINICAL DATA: Abdominal pain.

EXAM:
ULTRASOUND ABDOMEN COMPLETE

[Series 1: us abdomen complete · 0.26mm/px · 14 of 40 slices shown]
[im 1/40]
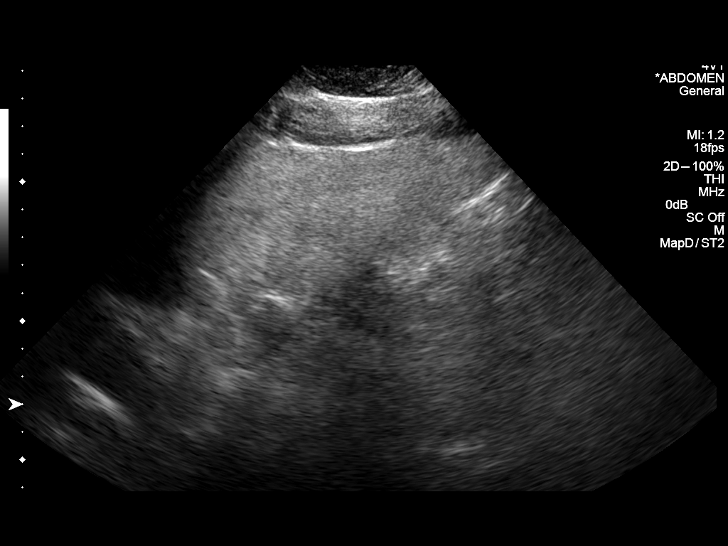
[im 4/40]
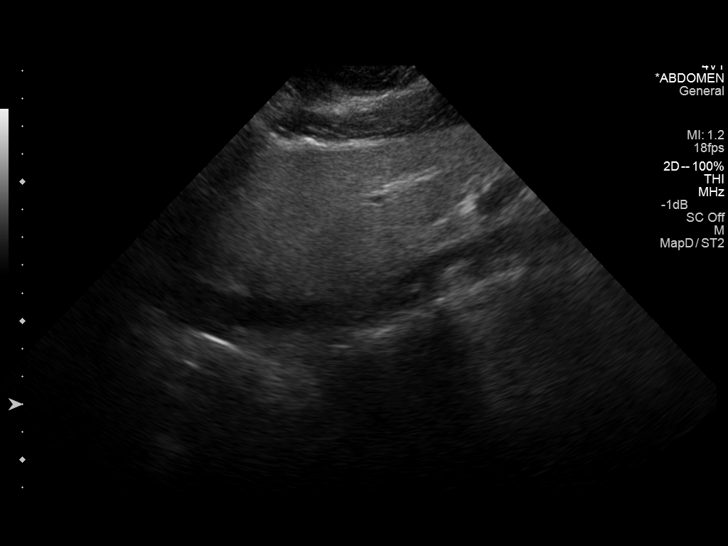
[im 7/40]
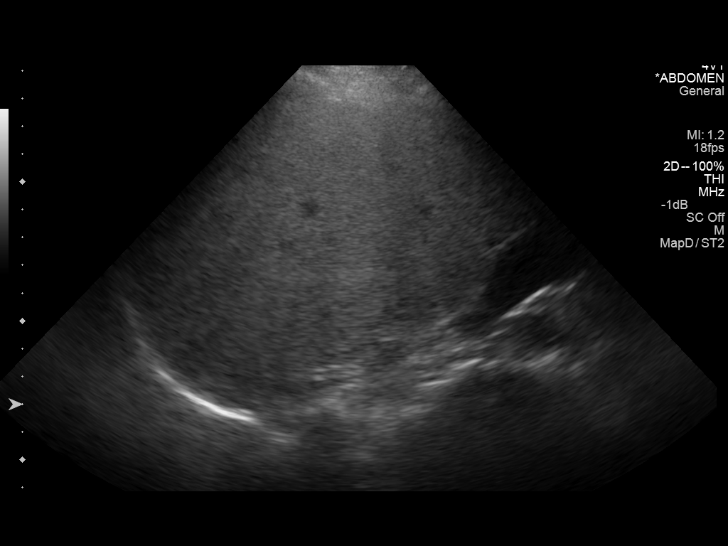
[im 10/40]
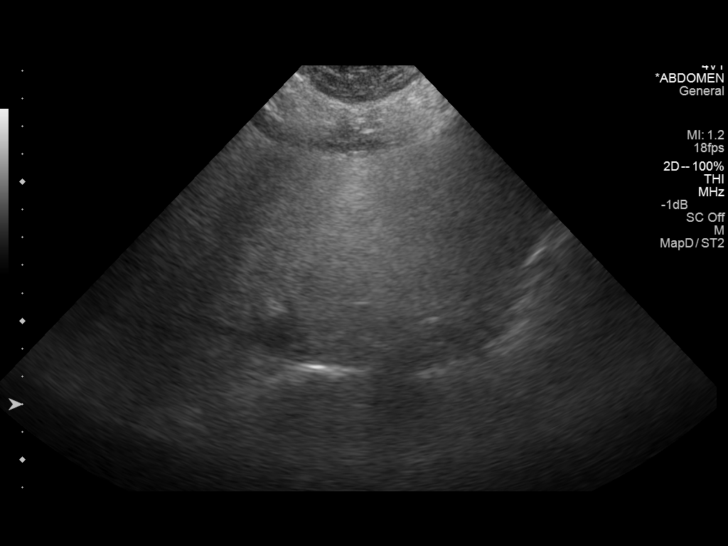
[im 14/40]
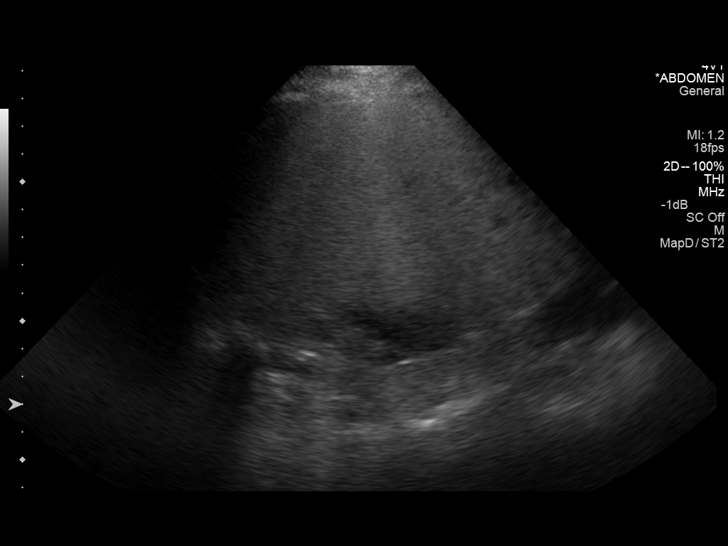
[im 15/40]
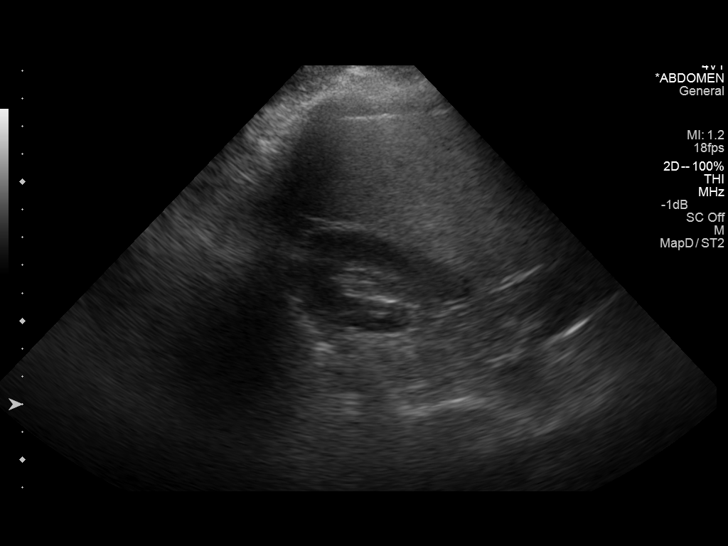
[im 18/40]
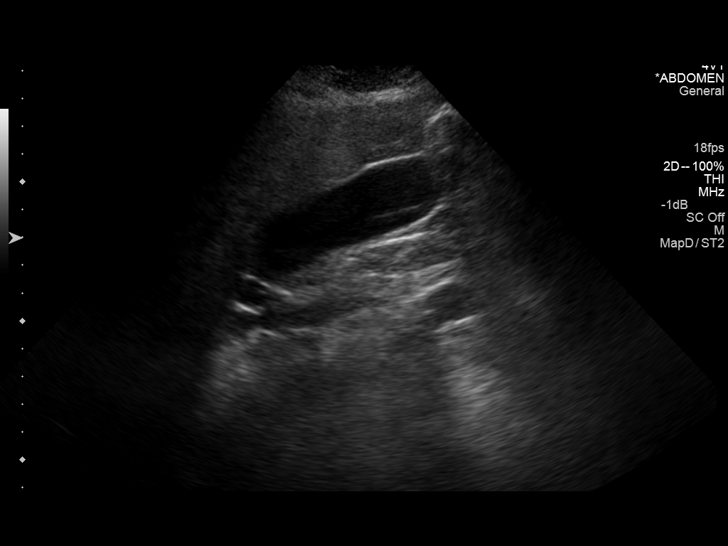
[im 22/40]
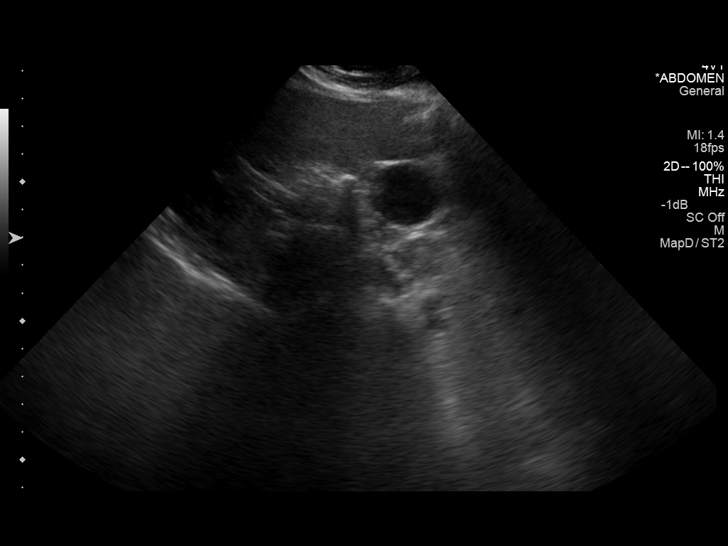
[im 25/40]
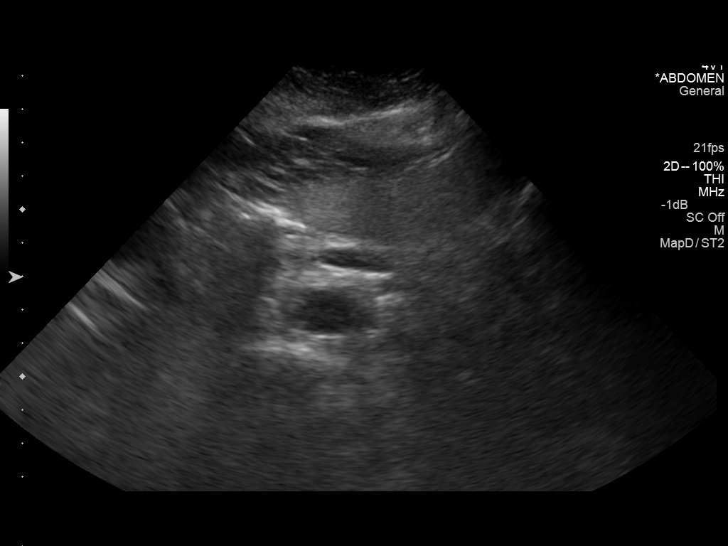
[im 27/40]
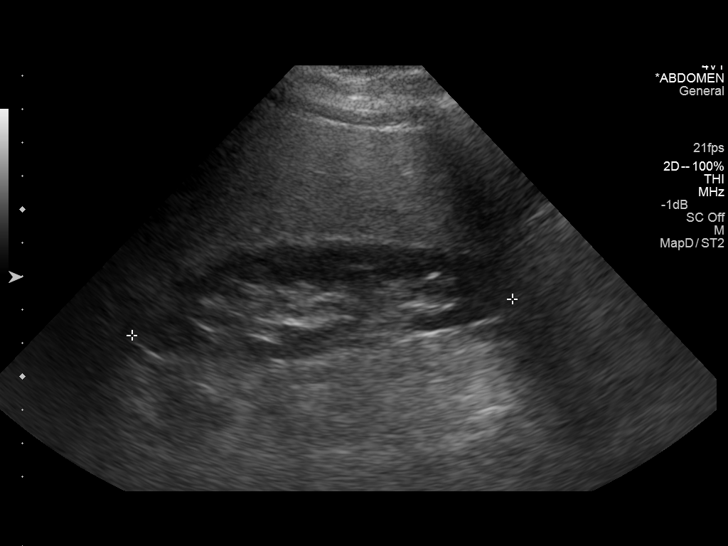
[im 30/40]
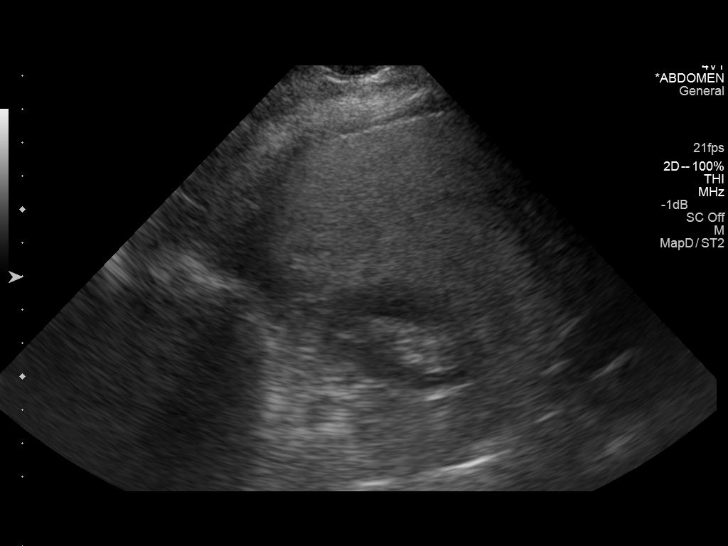
[im 33/40]
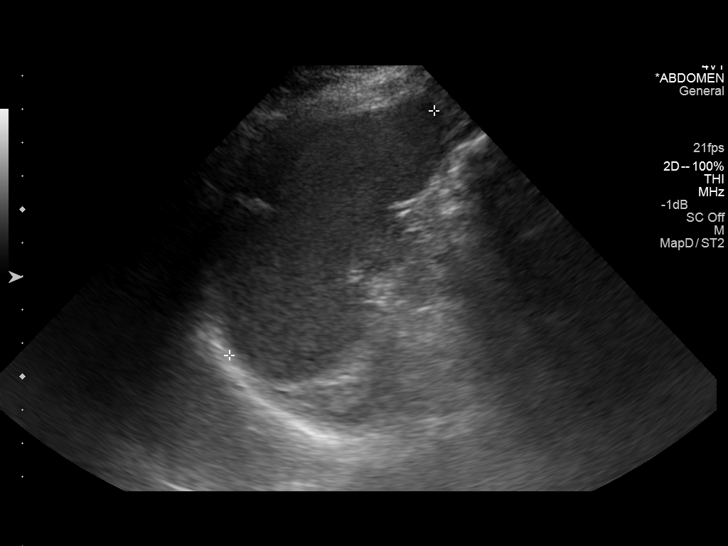
[im 36/40]
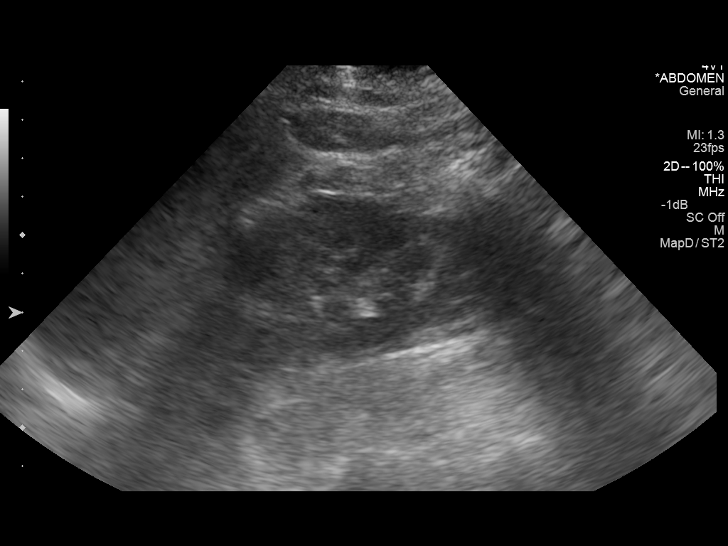
[im 40/40]
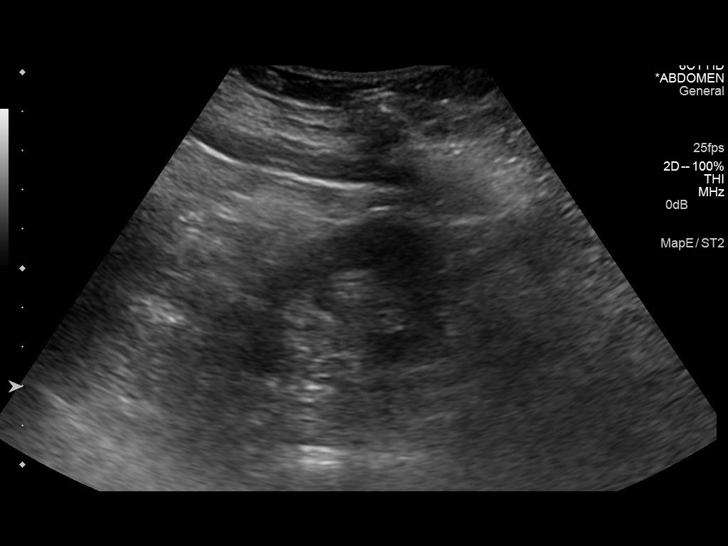

[14 of 25 positions shown; findings below may reference images not displayed]

FINDINGS: Gallbladder: No gallstones or wall thickening visualized. No
sonographic Murphy sign noted.

Common bile duct: Diameter: 6 mm, within normal limits.

Liver: Appears increased in echogenicity diffusely.

IVC: No abnormality visualized.

Pancreas: Obscured by bowel gas.

Spleen: 9.6 cm, negative.

Right Kidney: Length: 11.5 cm. Parenchymal echogenicity is within
normal limits. No hydronephrosis.

Left Kidney: Length: 10.3 cm. Parenchymal echogenicity is within
normal limits. A 3 mm echogenic lesion is seen in the interpolar
region, likely a stone. No hydronephrosis.

Abdominal aorta: No aneurysm visualized.

Other findings: None.
IMPRESSION: 1. No acute findings to explain the patient's pain.
2. Liver appears fatty.
3. Nonobstructing left renal stone.

## 2016-05-02 IMAGING — CT CT HEAD W/O CM
2 series · 16 of 30 positions shown, 18 images · non-contrast
Comparison: Head CT November 24, 2009 and brain MRI November 25, 2009

CLINICAL DATA: Two week history of dizziness

EXAM:
CT HEAD WITHOUT CONTRAST
TECHNIQUE: Contiguous axial images were obtained from the base of the skull
through the vertex without intravenous contrast.

[Series 201: head w/o, idose (1) · axial · non-contrast · 0.44mm/px · z∈[+117,+237]mm · 8 of 32 slices shown, 10 images]
[im 4/32  brain]
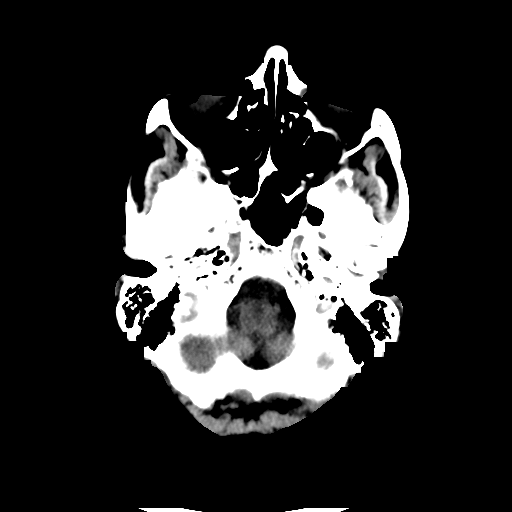
[im 4/32  bone]
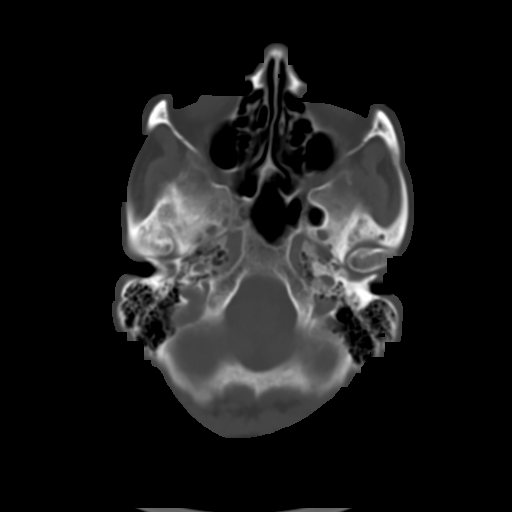
[im 7/32  brain]
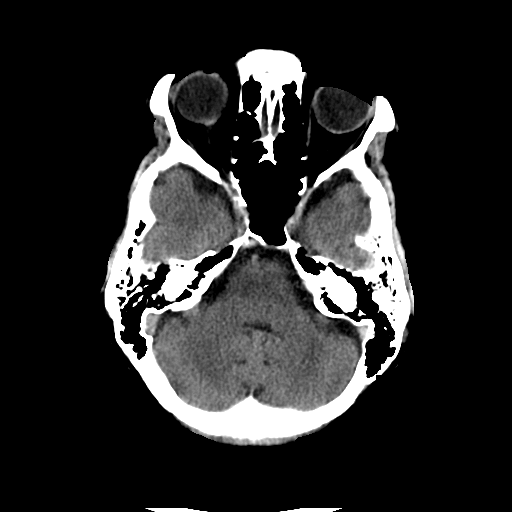
[im 11/32  brain]
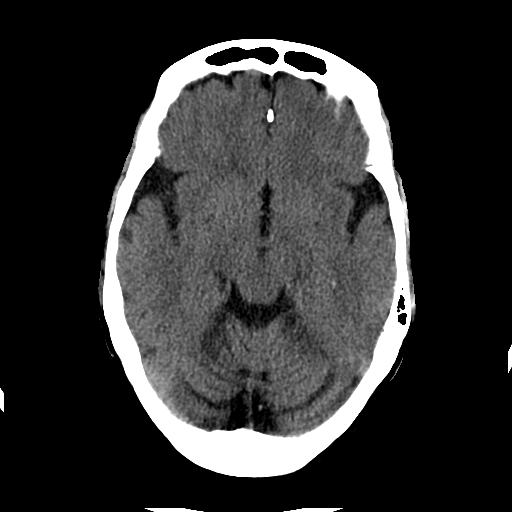
[im 14/32  brain]
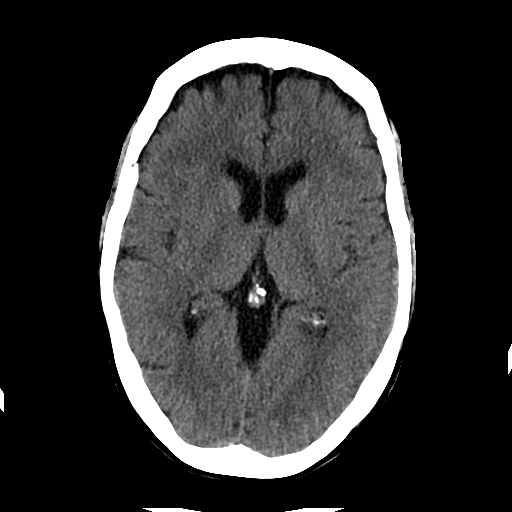
[im 18/32  brain]
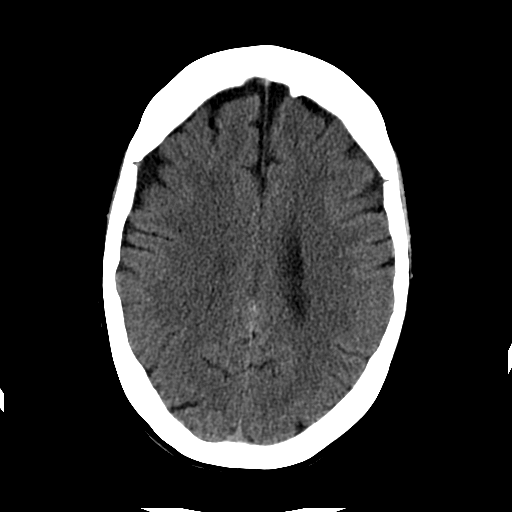
[im 18/32  bone]
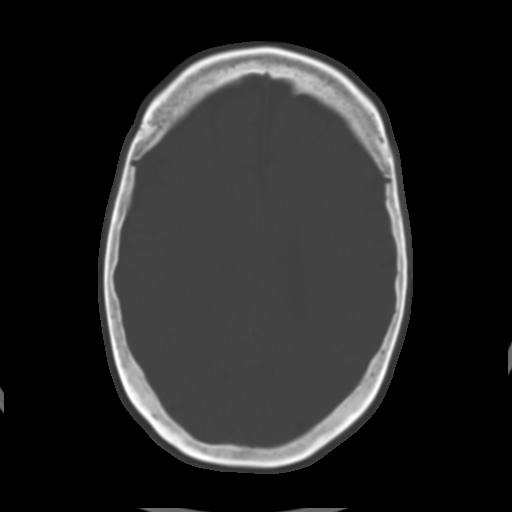
[im 21/32  brain]
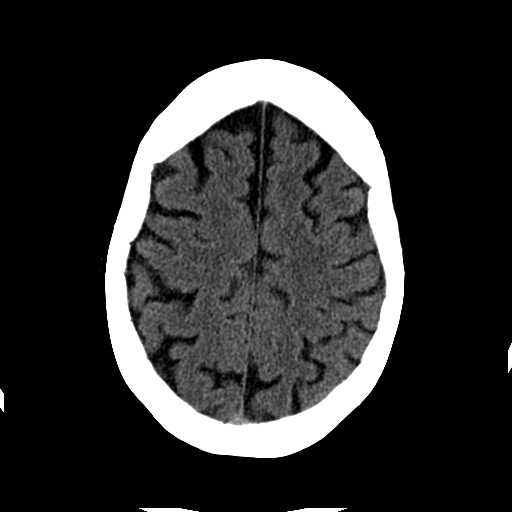
[im 25/32  brain]
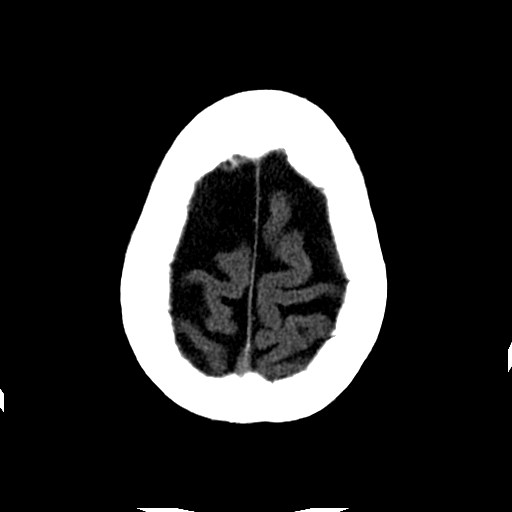
[im 28/32  brain]
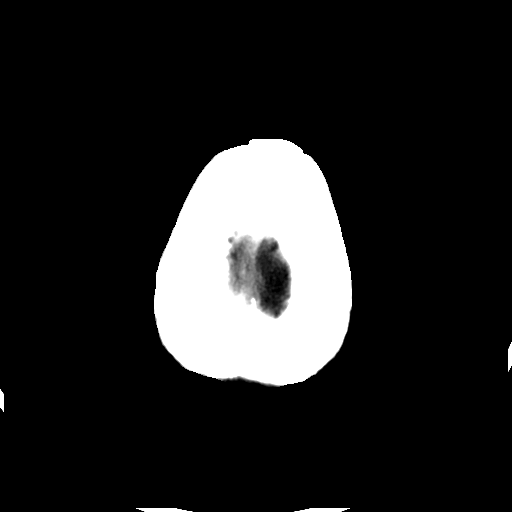

[Series 202: head w/o bone, idose (1) · axial · non-contrast · 0.44mm/px · z∈[+116,+241]mm · 8 of 64 slices shown]
[im 7/64  bone]
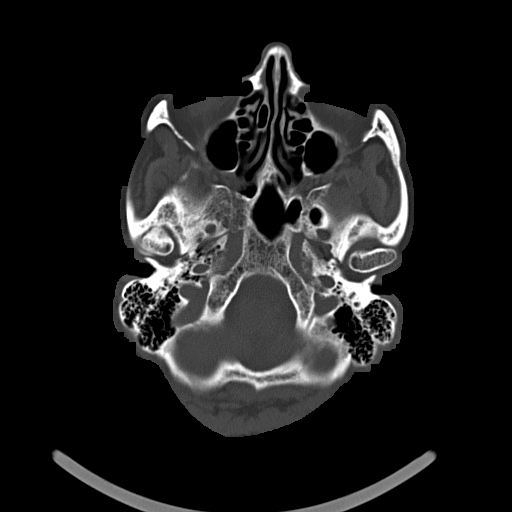
[im 14/64  bone]
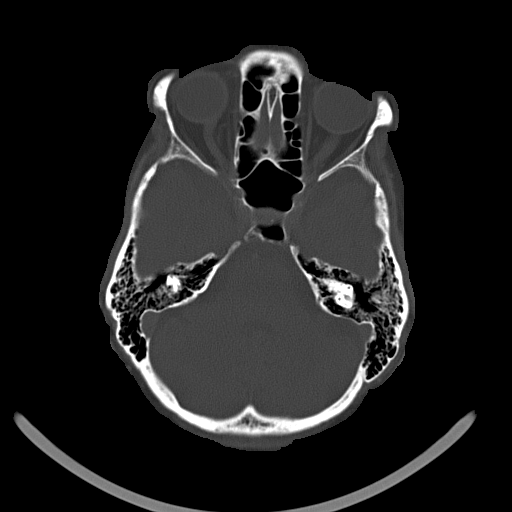
[im 20/64  bone]
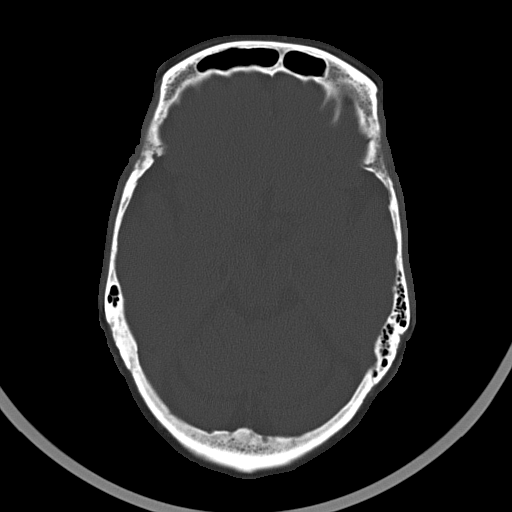
[im 27/64  bone]
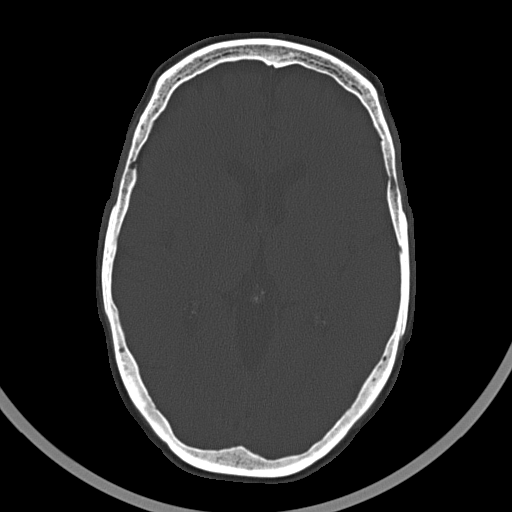
[im 37/64  bone]
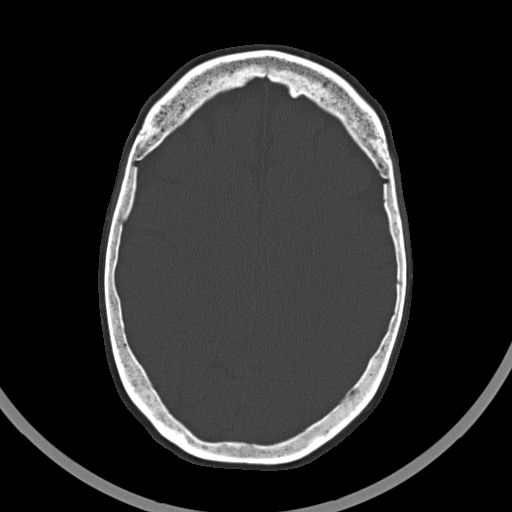
[im 44/64  bone]
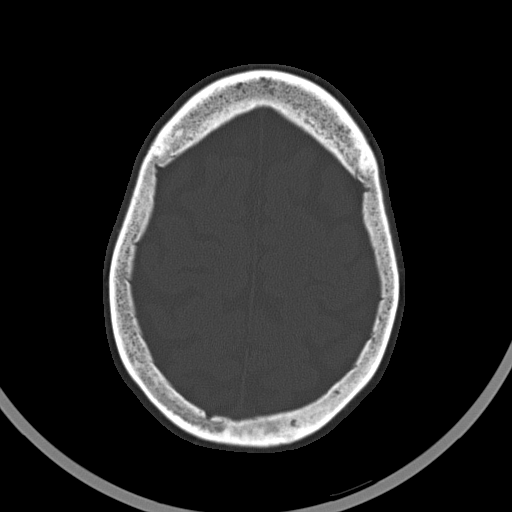
[im 50/64  bone]
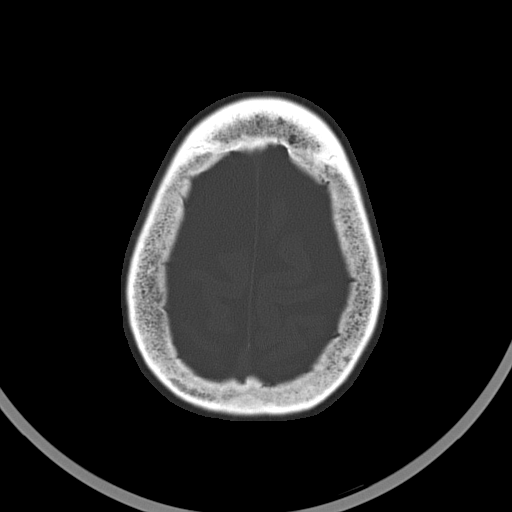
[im 57/64  bone]
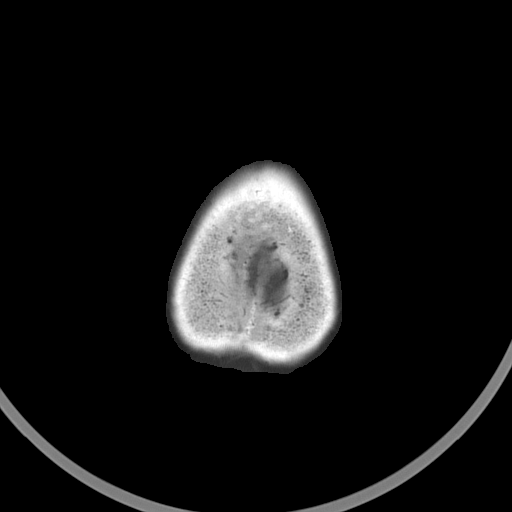

[16 of 30 positions shown; findings below may reference images not displayed]

FINDINGS: Mild diffuse atrophy is stable. There is no intracranial mass,
hemorrhage, extra-axial fluid collection, or midline shift. There is
rather minimal small vessel disease in the centra semiovale
bilaterally. A prior small lacunar infarct in the mid right
cerebellum seen on prior MR is not appreciable on this study. No new
gray-white compartment lesions are identified. There is no
demonstrable acute infarct on this study. Bony calvarium appears
intact. The mastoid air cells are clear.
IMPRESSION: Atrophy with mild periventricular small vessel disease. No
intracranial mass, hemorrhage, or acute appearing infarct.

## 2016-05-22 ENCOUNTER — Ambulatory Visit (INDEPENDENT_AMBULATORY_CARE_PROVIDER_SITE_OTHER): Payer: Medicare Other | Admitting: Physician Assistant

## 2016-05-22 ENCOUNTER — Ambulatory Visit: Payer: Medicare Other | Admitting: Physician Assistant

## 2016-05-22 ENCOUNTER — Encounter: Payer: Self-pay | Admitting: Physician Assistant

## 2016-05-22 ENCOUNTER — Ambulatory Visit (INDEPENDENT_AMBULATORY_CARE_PROVIDER_SITE_OTHER): Payer: Medicare Other | Admitting: *Deleted

## 2016-05-22 VITALS — BP 120/84 | HR 60 | Ht 69.0 in | Wt 145.0 lb

## 2016-05-22 DIAGNOSIS — I480 Paroxysmal atrial fibrillation: Secondary | ICD-10-CM | POA: Diagnosis not present

## 2016-05-22 DIAGNOSIS — E039 Hypothyroidism, unspecified: Secondary | ICD-10-CM

## 2016-05-22 DIAGNOSIS — R5383 Other fatigue: Secondary | ICD-10-CM

## 2016-05-22 DIAGNOSIS — Z7901 Long term (current) use of anticoagulants: Secondary | ICD-10-CM

## 2016-05-22 DIAGNOSIS — Z952 Presence of prosthetic heart valve: Secondary | ICD-10-CM

## 2016-05-22 DIAGNOSIS — I050 Rheumatic mitral stenosis: Secondary | ICD-10-CM

## 2016-05-22 DIAGNOSIS — I059 Rheumatic mitral valve disease, unspecified: Secondary | ICD-10-CM | POA: Diagnosis not present

## 2016-05-22 LAB — POCT INR: INR: 2.1

## 2016-05-22 NOTE — Progress Notes (Addendum)
Cardiology Office Note    Date:  05/22/2016  ID:  Jasmine Kelley, DOB Dec 15, 1945, MRN [id number] PCP:  Wenda Low, MD  Cardiologist:  Dr. Irish Lack   Chief Complaint: f/u mitral valve disease  History of Present Illness:  Jasmine Kelley is a 71 y.o. female with history of mitral stenosis s/p St. Jude mechanical MV replacement 2001, atrial fib (no further details known), mild pulm HTN (at time of pre-op cath 2001), anxiety, hypothroidism, rectus sheath hematoma, cerebellar stroke 2011 with subsequent gait disorder, GIB, orthostatic hypotension, hypertriglyceridemia who presents for yearly follow-up. Last echo 2009: EF 60-65%, normal RV, normally functioning MVR, pulm artery not well visualized. [place] 2005: normal cors. Labs on file from 2015 show Cr 0.85, Hgb 15.6.  She presents back for follow-up with her daughter. She has been doing well from a cardiovascular standpoint. She has chronic DOE while walking up hills but this is unchanged from prior. She also notes a generalized sense of fatigue for the last several years but nothing acute. No unusual palpitations, bleeding, or syncope. She has also noticed some progressive tinnitus, hearing loss and vertigo for the last several years. The vertigo is described as the room spinning with positional head changes or staring at a screen for too long.   Past Medical History:  Diagnosis Date  . A-fib ([employer])   . Allergic rhinitis   . Anxiety   . Bell's palsy   . Cerebellar stroke ([place])    11/11/09  . Chronic anticoagulation    coumadin, AFIB  . Dermatitis   . 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 GI bleed   . Hypothyroidism   . Left shoulder pain   . Major depression   . Osteoporosis    BMD 2013  . Pulmonary HTN   . PVD (peripheral vascular disease) ([place])   . Rectus sheath hematoma    2005  . S/P MVR (mitral valve replacement)    a. mitral stenosis s/p mechanical MVR 2001.    No past surgical history on  file.  Current Medications: Current Outpatient Prescriptions  Medication Sig Dispense Refill  . alendronate (FOSAMAX) 70 MG tablet Take 70 mg by mouth once a week.  5  . DULoxetine (CYMBALTA) 60 MG capsule Take 60 mg by mouth daily.     . ferrous fumarate (HEMOCYTE - 106 MG FE) 325 (106 FE) MG TABS tablet Take 1 tablet by mouth daily.    Marland Kitchen LORazepam (ATIVAN) 1 MG tablet Take 0.5 tablets (0.5 mg total) by mouth every 6 (six) hours as needed for anxiety. 15 tablet 0  . simvastatin (ZOCOR) 10 MG tablet Take 10 mg by mouth daily at 6 PM.     . SYNTHROID 100 MCG tablet     . valACYclovir (VALTREX) 500 MG tablet Take 500 mg by mouth daily as needed (blisters).     . Vitamin D, Cholecalciferol, 1000 UNITS TABS Take 1 tablet by mouth daily.    Marland Kitchen warfarin (COUMADIN) 2 MG tablet TAKE AS DIRECTED BY COUMADIN CLINIC 30 tablet 3   No current facility-administered medications for this visit.      Allergies:   Bee venom; Benadryl [diphenhydramine hcl (sleep)]; Buspirone; Chlorpromazine hcl; and Peanut butter flavor   Social History   Social History  . Marital status: Married    Spouse name: N/A  . Number of children: N/A  . Years of education: N/A   Social History Main Topics  . Smoking status: Never Smoker  .  Smokeless tobacco: Never Used  . Alcohol use Yes  . Drug use: No  . Sexual activity: No   Other Topics Concern  . None   Social History Narrative  . None     Family History:  The patient's family history includes Breast cancer in her mother; CVA in her father; Diabetes in her father; Heart attack in her father; Stroke in her father.   ROS:   Please see the history of present illness. No recent BRBPR, melena, hematemesis. Does hear "crickets" in her ears sometimes, longstanding tinnitus.  All other systems are reviewed and otherwise negative.    PHYSICAL EXAM:   VS:  BP 120/84 (BP Location: Left Arm, Patient Position: Sitting, Cuff Size: Normal)   Pulse 60   Ht 5\' 9"  (1.753  m)   Wt 145 lb (65.8 kg)   BMI 21.41 kg/m   BMI: Body mass index is 21.41 kg/m. GEN: Well nourished, well developed WF, in no acute distress  HEENT: normocephalic, atraumatic Neck: no JVD, carotid bruits, or masses Cardiac: RRR; no murmurs, rubs, or gallops, no edema - crisp valve click Respiratory:  clear to auscultation bilaterally, normal work of breathing GI: soft, nontender, nondistended, + BS MS: no deformity or atrophy  Skin: warm and dry, no rash Neuro:  Alert and Oriented x 3, Strength and sensation are intact, follows commands Psych: euthymic mood, full affect  Wt Readings from Last 3 Encounters:  05/22/16 145 lb (65.8 kg)  03/07/15 140 lb (63.5 kg)  12/08/13 132 lb (59.9 kg)      Studies/Labs Reviewed:   EKG:  EKG was ordered today and personally reviewed by me and demonstrates NSR 71bpm, no acute St-T changes (first beat with artifact)  Recent Labs: No results found for requested labs within last 8760 hours.   Lipid Panel   Additional studies/ records that were reviewed today include: Summarized above    ASSESSMENT & PLAN:   1. Mitral valve stenosis s/p mechanical MVR - doing well overall. Her chronic DOE and fatigue are unchanged. It has been many years since last echo assessment. Will update echocardiogram to ensure valve functionality, non-urgent study. Continue Coumadin per [facility]in Clinic. Reminded patient of need for SBE prophylaxis at time of dental work. She states her PCP has been managing this but I asked her to please contact our office if she goes for dental work. 2. Fatigue, unspecified - will check screening labs today including CBC, TSH, BMET. Will also update echocardiogram. Given her night sweats I have recommended she f/u with her primary care provider for further input regarding this. 3. Paroxysmal atrial fib - patient does not know any details of this diagnosis. Maintaining NSR. She is already on Coumadin for MVR. 4. Chronic anticoagulation -  as above. Has Coumadin f/u today. 5. Hypothyroidism - she reports adjustment 6 months ago by PCP. Recheck TSH today given her fatigue.  Disposition: F/u with Dr. Irish Lack in 1 year. I also recommended she f/u with her PCP for further evaluation of her audiological issues.  Medication Adjustments/Labs and Tests Ordered: Current medicines are reviewed at length with the patient today.  Concerns regarding medicines are outlined above. Medication changes, Labs and Tests ordered today are summarized above and listed in the Patient Instructions accessible in Encounters.   Raechel Ache PA-C  05/22/2016 2:35 PM    [facility] [address], [place], [place]  [address] Phone: [phone]; Fax: [phone]

## 2016-05-22 NOTE — Patient Instructions (Addendum)
Medication Instructions:   Your physician recommends that you continue on your current medications as directed. Please refer to the Current Medication list given to you today.   If you need a refill on your cardiac medications before your next appointment, please call your pharmacy.  Labwork: BMET CBC TSH    Testing/Procedures:  Your physician has requested that you have an echocardiogram. Echocardiography is a painless test that uses sound waves to create images of your heart. It provides your doctor with information about the size and shape of your heart and how well your heart's chambers and valves are working. This procedure takes approximately one hour. There are no restrictions for this procedure.     Follow-Up: Your physician wants you to follow-up in: [place] will receive a reminder letter in the mail two months in advance. If you don't receive a letter, please call our office to schedule the follow-up appointment.     Any Other Special Instructions Will Be Listed Below (If Applicable).  PLEASE ASK YOUR PRIMARY CARE ABOUT THE [facility]

## 2016-05-23 LAB — CBC
Hematocrit: 40.8 % (ref 34.0–46.6)
Hemoglobin: 13.7 g/dL (ref 11.1–15.9)
MCH: 28.7 pg (ref 26.6–33.0)
MCHC: 33.6 g/dL (ref 31.5–35.7)
MCV: 85 fL (ref 79–97)
Platelets: 314 10*3/uL (ref 150–379)
RBC: 4.78 x10E6/uL (ref 3.77–5.28)
RDW: 13.6 % (ref 12.3–15.4)
WBC: 7.8 10*3/uL (ref 3.4–10.8)

## 2016-05-23 LAB — BASIC METABOLIC PANEL
BUN/Creatinine Ratio: 27 (ref 12–28)
BUN: 21 mg/dL (ref 8–27)
CO2: 25 mmol/L (ref 18–29)
Calcium: 9.8 mg/dL (ref 8.7–10.3)
Chloride: 97 mmol/L (ref 96–106)
Creatinine, Ser: 0.78 mg/dL (ref 0.57–1.00)
GFR calc Af Amer: 90 mL/min/{1.73_m2} (ref >59)
GFR calc non Af Amer: 78 mL/min/{1.73_m2} (ref >59)
Glucose: 84 mg/dL (ref 65–99)
Potassium: 4.7 mmol/L (ref 3.5–5.2)
Sodium: 139 mmol/L (ref 134–144)

## 2016-05-23 LAB — TSH: TSH: 3.86 u[IU]/mL (ref 0.450–4.500)

## 2016-05-29 ENCOUNTER — Encounter: Payer: Self-pay | Admitting: *Deleted

## 2016-05-29 ENCOUNTER — Telehealth: Payer: Self-pay | Admitting: Physician Assistant

## 2016-05-29 NOTE — Telephone Encounter (Signed)
Follow Up   Pt returning phone call about lab work.

## 2016-06-01 NOTE — Telephone Encounter (Signed)
Returned pts call.  She was calling re: her lab results, which are normal, but left a detailed message on pts vm according to dpr.

## 2016-06-05 ENCOUNTER — Ambulatory Visit (HOSPITAL_COMMUNITY): Payer: Medicare Other | Attending: Cardiovascular Disease

## 2016-06-05 ENCOUNTER — Ambulatory Visit (INDEPENDENT_AMBULATORY_CARE_PROVIDER_SITE_OTHER): Payer: Medicare Other | Admitting: *Deleted

## 2016-06-05 ENCOUNTER — Other Ambulatory Visit: Payer: Self-pay

## 2016-06-05 DIAGNOSIS — I050 Rheumatic mitral stenosis: Secondary | ICD-10-CM

## 2016-06-05 DIAGNOSIS — Z952 Presence of prosthetic heart valve: Secondary | ICD-10-CM | POA: Insufficient documentation

## 2016-06-05 DIAGNOSIS — I059 Rheumatic mitral valve disease, unspecified: Secondary | ICD-10-CM | POA: Diagnosis not present

## 2016-06-05 DIAGNOSIS — I4891 Unspecified atrial fibrillation: Secondary | ICD-10-CM | POA: Diagnosis not present

## 2016-06-05 DIAGNOSIS — Z7901 Long term (current) use of anticoagulants: Secondary | ICD-10-CM | POA: Diagnosis not present

## 2016-06-05 LAB — POCT INR: INR: 3.3

## 2016-06-16 ENCOUNTER — Encounter: Payer: Self-pay | Admitting: *Deleted

## 2016-06-30 DIAGNOSIS — E039 Hypothyroidism, unspecified: Secondary | ICD-10-CM | POA: Diagnosis not present

## 2016-06-30 DIAGNOSIS — I270 Primary pulmonary hypertension: Secondary | ICD-10-CM | POA: Diagnosis not present

## 2016-06-30 DIAGNOSIS — J309 Allergic rhinitis, unspecified: Secondary | ICD-10-CM | POA: Diagnosis not present

## 2016-06-30 DIAGNOSIS — I639 Cerebral infarction, unspecified: Secondary | ICD-10-CM | POA: Diagnosis not present

## 2016-06-30 DIAGNOSIS — E782 Mixed hyperlipidemia: Secondary | ICD-10-CM | POA: Diagnosis not present

## 2016-06-30 DIAGNOSIS — F322 Major depressive disorder, single episode, severe without psychotic features: Secondary | ICD-10-CM | POA: Diagnosis not present

## 2016-06-30 DIAGNOSIS — I482 Chronic atrial fibrillation: Secondary | ICD-10-CM | POA: Diagnosis not present

## 2016-06-30 DIAGNOSIS — I739 Peripheral vascular disease, unspecified: Secondary | ICD-10-CM | POA: Diagnosis not present

## 2016-07-10 ENCOUNTER — Ambulatory Visit (INDEPENDENT_AMBULATORY_CARE_PROVIDER_SITE_OTHER): Payer: Medicare Other | Admitting: Pharmacist

## 2016-07-10 DIAGNOSIS — I059 Rheumatic mitral valve disease, unspecified: Secondary | ICD-10-CM | POA: Diagnosis not present

## 2016-07-10 DIAGNOSIS — Z7901 Long term (current) use of anticoagulants: Secondary | ICD-10-CM | POA: Diagnosis not present

## 2016-07-10 LAB — POCT INR: INR: 2.7

## 2016-08-02 ENCOUNTER — Other Ambulatory Visit: Payer: Self-pay | Admitting: Interventional Cardiology

## 2016-08-14 ENCOUNTER — Ambulatory Visit (INDEPENDENT_AMBULATORY_CARE_PROVIDER_SITE_OTHER): Payer: Medicare Other | Admitting: *Deleted

## 2016-08-14 DIAGNOSIS — I059 Rheumatic mitral valve disease, unspecified: Secondary | ICD-10-CM

## 2016-08-14 DIAGNOSIS — Z7901 Long term (current) use of anticoagulants: Secondary | ICD-10-CM | POA: Diagnosis not present

## 2016-08-14 LAB — POCT INR: INR: 1.6

## 2016-08-31 ENCOUNTER — Ambulatory Visit (INDEPENDENT_AMBULATORY_CARE_PROVIDER_SITE_OTHER): Payer: Medicare Other | Admitting: *Deleted

## 2016-08-31 DIAGNOSIS — Z7901 Long term (current) use of anticoagulants: Secondary | ICD-10-CM | POA: Diagnosis not present

## 2016-08-31 DIAGNOSIS — I059 Rheumatic mitral valve disease, unspecified: Secondary | ICD-10-CM

## 2016-08-31 LAB — POCT INR: INR: 2.4

## 2016-09-16 ENCOUNTER — Ambulatory Visit (INDEPENDENT_AMBULATORY_CARE_PROVIDER_SITE_OTHER): Payer: Medicare Other | Admitting: *Deleted

## 2016-09-16 DIAGNOSIS — Z7901 Long term (current) use of anticoagulants: Secondary | ICD-10-CM

## 2016-09-16 DIAGNOSIS — I059 Rheumatic mitral valve disease, unspecified: Secondary | ICD-10-CM

## 2016-09-16 LAB — POCT INR: INR: 2.2

## 2016-09-22 ENCOUNTER — Other Ambulatory Visit: Payer: Self-pay | Admitting: *Deleted

## 2016-09-22 MED ORDER — WARFARIN SODIUM 2 MG PO TABS: ORAL_TABLET | ORAL | 3 refills | Status: DC

## 2016-09-22 NOTE — Telephone Encounter (Signed)
Pharmacy requests ninety day. 

## 2016-09-23 ENCOUNTER — Other Ambulatory Visit: Payer: Self-pay | Admitting: *Deleted

## 2016-09-23 MED ORDER — WARFARIN SODIUM 2 MG PO TABS: ORAL_TABLET | ORAL | 3 refills | Status: DC

## 2016-09-23 NOTE — Telephone Encounter (Signed)
Pharmacy requests ninety day. 

## 2016-10-06 DIAGNOSIS — F322 Major depressive disorder, single episode, severe without psychotic features: Secondary | ICD-10-CM | POA: Diagnosis not present

## 2016-10-06 DIAGNOSIS — I739 Peripheral vascular disease, unspecified: Secondary | ICD-10-CM | POA: Diagnosis not present

## 2016-10-06 DIAGNOSIS — I270 Primary pulmonary hypertension: Secondary | ICD-10-CM | POA: Diagnosis not present

## 2016-10-06 DIAGNOSIS — E039 Hypothyroidism, unspecified: Secondary | ICD-10-CM | POA: Diagnosis not present

## 2016-10-06 DIAGNOSIS — M810 Age-related osteoporosis without current pathological fracture: Secondary | ICD-10-CM | POA: Diagnosis not present

## 2016-10-06 DIAGNOSIS — I482 Chronic atrial fibrillation: Secondary | ICD-10-CM | POA: Diagnosis not present

## 2016-10-06 DIAGNOSIS — H9190 Unspecified hearing loss, unspecified ear: Secondary | ICD-10-CM | POA: Diagnosis not present

## 2016-10-06 DIAGNOSIS — I639 Cerebral infarction, unspecified: Secondary | ICD-10-CM | POA: Diagnosis not present

## 2016-10-06 DIAGNOSIS — E782 Mixed hyperlipidemia: Secondary | ICD-10-CM | POA: Diagnosis not present

## 2016-11-12 ENCOUNTER — Ambulatory Visit (INDEPENDENT_AMBULATORY_CARE_PROVIDER_SITE_OTHER): Payer: Medicare Other | Admitting: *Deleted

## 2016-11-12 DIAGNOSIS — H903 Sensorineural hearing loss, bilateral: Secondary | ICD-10-CM | POA: Diagnosis not present

## 2016-11-12 DIAGNOSIS — Z7901 Long term (current) use of anticoagulants: Secondary | ICD-10-CM | POA: Diagnosis not present

## 2016-11-12 DIAGNOSIS — I059 Rheumatic mitral valve disease, unspecified: Secondary | ICD-10-CM | POA: Diagnosis not present

## 2016-11-12 LAB — POCT INR: INR: 6.5

## 2016-11-12 LAB — PROTIME-INR
INR: 5.3 — ABNORMAL HIGH (ref 0.8–1.2)
Prothrombin Time: 50.7 s — ABNORMAL HIGH (ref 9.1–12.0)

## 2016-11-17 ENCOUNTER — Other Ambulatory Visit: Payer: Self-pay | Admitting: *Deleted

## 2016-11-17 MED ORDER — WARFARIN SODIUM 2 MG PO TABS: ORAL_TABLET | ORAL | 1 refills | Status: DC

## 2016-11-17 NOTE — Telephone Encounter (Signed)
Pharmacy requests a ninety day supply. 

## 2016-12-04 ENCOUNTER — Ambulatory Visit (INDEPENDENT_AMBULATORY_CARE_PROVIDER_SITE_OTHER): Payer: Medicare Other | Admitting: *Deleted

## 2016-12-04 DIAGNOSIS — Z7901 Long term (current) use of anticoagulants: Secondary | ICD-10-CM

## 2016-12-04 DIAGNOSIS — I059 Rheumatic mitral valve disease, unspecified: Secondary | ICD-10-CM

## 2016-12-04 LAB — POCT INR: INR: 5.7

## 2016-12-14 ENCOUNTER — Ambulatory Visit (INDEPENDENT_AMBULATORY_CARE_PROVIDER_SITE_OTHER): Payer: Medicare Other

## 2016-12-14 DIAGNOSIS — Z7901 Long term (current) use of anticoagulants: Secondary | ICD-10-CM

## 2016-12-14 DIAGNOSIS — I059 Rheumatic mitral valve disease, unspecified: Secondary | ICD-10-CM | POA: Diagnosis not present

## 2016-12-14 LAB — POCT INR: INR: 3.2

## 2016-12-22 ENCOUNTER — Other Ambulatory Visit: Payer: Self-pay | Admitting: Interventional Cardiology

## 2017-01-26 ENCOUNTER — Ambulatory Visit (INDEPENDENT_AMBULATORY_CARE_PROVIDER_SITE_OTHER): Payer: Medicare Other | Admitting: Pharmacist

## 2017-01-26 DIAGNOSIS — Z7901 Long term (current) use of anticoagulants: Secondary | ICD-10-CM | POA: Diagnosis not present

## 2017-01-26 DIAGNOSIS — I059 Rheumatic mitral valve disease, unspecified: Secondary | ICD-10-CM | POA: Diagnosis not present

## 2017-01-26 LAB — POCT INR: INR: 2.4

## 2017-02-09 ENCOUNTER — Ambulatory Visit (INDEPENDENT_AMBULATORY_CARE_PROVIDER_SITE_OTHER): Payer: Medicare Other | Admitting: *Deleted

## 2017-02-09 DIAGNOSIS — I059 Rheumatic mitral valve disease, unspecified: Secondary | ICD-10-CM

## 2017-02-09 DIAGNOSIS — Z5181 Encounter for therapeutic drug level monitoring: Secondary | ICD-10-CM

## 2017-02-09 DIAGNOSIS — Z7901 Long term (current) use of anticoagulants: Secondary | ICD-10-CM | POA: Diagnosis not present

## 2017-02-09 LAB — POCT INR: INR: 2

## 2017-02-10 ENCOUNTER — Other Ambulatory Visit: Payer: Self-pay | Admitting: Internal Medicine

## 2017-02-10 DIAGNOSIS — E782 Mixed hyperlipidemia: Secondary | ICD-10-CM | POA: Diagnosis not present

## 2017-02-10 DIAGNOSIS — I270 Primary pulmonary hypertension: Secondary | ICD-10-CM | POA: Diagnosis not present

## 2017-02-10 DIAGNOSIS — Z1239 Encounter for other screening for malignant neoplasm of breast: Secondary | ICD-10-CM | POA: Diagnosis not present

## 2017-02-10 DIAGNOSIS — M810 Age-related osteoporosis without current pathological fracture: Secondary | ICD-10-CM | POA: Diagnosis not present

## 2017-02-10 DIAGNOSIS — F322 Major depressive disorder, single episode, severe without psychotic features: Secondary | ICD-10-CM | POA: Diagnosis not present

## 2017-02-10 DIAGNOSIS — I739 Peripheral vascular disease, unspecified: Secondary | ICD-10-CM | POA: Diagnosis not present

## 2017-02-10 DIAGNOSIS — F419 Anxiety disorder, unspecified: Secondary | ICD-10-CM | POA: Diagnosis not present

## 2017-02-10 DIAGNOSIS — I482 Chronic atrial fibrillation: Secondary | ICD-10-CM | POA: Diagnosis not present

## 2017-02-10 DIAGNOSIS — Z23 Encounter for immunization: Secondary | ICD-10-CM | POA: Diagnosis not present

## 2017-02-10 DIAGNOSIS — I639 Cerebral infarction, unspecified: Secondary | ICD-10-CM | POA: Diagnosis not present

## 2017-02-10 DIAGNOSIS — E039 Hypothyroidism, unspecified: Secondary | ICD-10-CM | POA: Diagnosis not present

## 2017-02-15 ENCOUNTER — Other Ambulatory Visit: Payer: Self-pay | Admitting: Interventional Cardiology

## 2017-04-08 ENCOUNTER — Ambulatory Visit (INDEPENDENT_AMBULATORY_CARE_PROVIDER_SITE_OTHER): Payer: Medicare Other

## 2017-04-08 DIAGNOSIS — Z7901 Long term (current) use of anticoagulants: Secondary | ICD-10-CM | POA: Diagnosis not present

## 2017-04-08 DIAGNOSIS — I059 Rheumatic mitral valve disease, unspecified: Secondary | ICD-10-CM

## 2017-04-08 LAB — POCT INR: INR: 2.3

## 2017-04-08 NOTE — Patient Instructions (Signed)
Take 2 tablets today, then resume same dosage 1 tablet daily then 2 tablets on Tuesdays and Fridays. Recheck INR in 3 weeks.

## 2017-04-14 ENCOUNTER — Other Ambulatory Visit: Payer: Self-pay | Admitting: Interventional Cardiology

## 2017-05-18 DIAGNOSIS — Z1389 Encounter for screening for other disorder: Secondary | ICD-10-CM | POA: Diagnosis not present

## 2017-05-18 DIAGNOSIS — I270 Primary pulmonary hypertension: Secondary | ICD-10-CM | POA: Diagnosis not present

## 2017-05-18 DIAGNOSIS — F322 Major depressive disorder, single episode, severe without psychotic features: Secondary | ICD-10-CM | POA: Diagnosis not present

## 2017-05-18 DIAGNOSIS — M810 Age-related osteoporosis without current pathological fracture: Secondary | ICD-10-CM | POA: Diagnosis not present

## 2017-05-18 DIAGNOSIS — I639 Cerebral infarction, unspecified: Secondary | ICD-10-CM | POA: Diagnosis not present

## 2017-05-18 DIAGNOSIS — I482 Chronic atrial fibrillation: Secondary | ICD-10-CM | POA: Diagnosis not present

## 2017-05-18 DIAGNOSIS — H9190 Unspecified hearing loss, unspecified ear: Secondary | ICD-10-CM | POA: Diagnosis not present

## 2017-05-18 DIAGNOSIS — E039 Hypothyroidism, unspecified: Secondary | ICD-10-CM | POA: Diagnosis not present

## 2017-06-03 ENCOUNTER — Ambulatory Visit (INDEPENDENT_AMBULATORY_CARE_PROVIDER_SITE_OTHER): Payer: Medicare Other

## 2017-06-03 DIAGNOSIS — I059 Rheumatic mitral valve disease, unspecified: Secondary | ICD-10-CM | POA: Diagnosis not present

## 2017-06-03 DIAGNOSIS — Z7901 Long term (current) use of anticoagulants: Secondary | ICD-10-CM | POA: Diagnosis not present

## 2017-06-03 LAB — POCT INR: INR: 3.2

## 2017-06-03 NOTE — Patient Instructions (Signed)
Description   Skip today's dosage of Coumadin, then resume same dosage 1 tablet daily then 2 tablets on Tuesdays and Fridays. Recheck INR in 3 weeks.

## 2017-06-09 ENCOUNTER — Encounter: Payer: Self-pay | Admitting: Interventional Cardiology

## 2017-06-16 NOTE — Progress Notes (Signed)
Cardiology Office Note   Date:  06/17/2017   ID:  Kana, Reimann [birth year], MRN [id number]  PCP:  Wenda Low, MD    No chief complaint on file. MV replacement   Wt Readings from Last 3 Encounters:  06/17/17 148 lb 12.8 oz (67.5 kg)  05/22/16 145 lb (65.8 kg)  03/07/15 140 lb (63.5 kg)       History of Present Illness: Jasmine Kelley is a 72 y.o. female  who has a history of a mechanical mitral valve replacement many years ago.  Denies : Chest pain. Dizziness. Leg edema. Nitroglycerin use. Orthopnea. Palpitations. Paroxysmal nocturnal dyspnea.  Syncope.   She has some DOE.  She tries to walk daily.    No bleeding issues, other than bleeding gums.     Past Medical History:  Diagnosis Date  . A-fib ([place])   . Allergic rhinitis   . Anxiety   . Bell's palsy   . Cerebellar stroke ([facility])    11/11/09  . Chronic anticoagulation    coumadin, AFIB  . Dermatitis   . 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 GI bleed   . Hypothyroidism   . Left shoulder pain   . Major depression   . Osteoporosis    BMD 2013  . Pulmonary HTN ([place])   . PVD (peripheral vascular disease) ([place])   . Rectus sheath hematoma    2005  . S/P MVR (mitral valve replacement)    a. mitral stenosis s/p mechanical MVR 2001.    History reviewed. No pertinent surgical history.   Current Outpatient Medications  Medication Sig Dispense Refill  . alendronate (FOSAMAX) 70 MG tablet Take 70 mg by mouth once a week.  5  . DULoxetine (CYMBALTA) 60 MG capsule Take 60 mg by mouth daily.     . ferrous fumarate (HEMOCYTE - 106 MG FE) 325 (106 FE) MG TABS tablet Take 1 tablet by mouth daily.    Marland Kitchen LORazepam (ATIVAN) 1 MG tablet Take 0.5 tablets (0.5 mg total) by mouth every 6 (six) hours as needed for anxiety. 15 tablet 0  . simvastatin (ZOCOR) 10 MG tablet Take 10 mg by mouth daily at 6 PM.     . SYNTHROID 100 MCG tablet     . valACYclovir (VALTREX) 500 MG tablet Take 500 mg by mouth  daily as needed (blisters).     . Vitamin D, Cholecalciferol, 1000 UNITS TABS Take 1 tablet by mouth daily.    Marland Kitchen warfarin (COUMADIN) 2 MG tablet TAKE AS DIRECTED BY COUMADIN CLINIC 40 tablet 2   No current facility-administered medications for this visit.     Allergies:   Bee venom; Benadryl [diphenhydramine hcl (sleep)]; Buspirone; Chlorpromazine hcl; Chlorpromazine hcl; Diphenhydramine hcl; Other; and Peanut butter flavor    Social History:  The patient  reports that  has never smoked. she has never used smokeless tobacco. She reports that she drinks alcohol. She reports that she does not use drugs.   Family History:  The patient's family history includes Breast cancer in her mother; CVA in her father; Diabetes in her father; Heart attack in her father; Stroke in her father.    ROS:  Please see the history of present illness.   Otherwise, review of systems are positive for chronic DOE.   All other systems are reviewed and negative.    PHYSICAL EXAM: VS:  BP (!) 142/80   Pulse 90   Ht 5\' 9"  (1.753  m)   Wt 148 lb 12.8 oz (67.5 kg)   SpO2 98%   BMI 21.97 kg/m  , BMI Body mass index is 21.97 kg/m. GEN: Well nourished, well developed, in no acute distress  HEENT: normal  Neck: no JVD, carotid bruits, or masses Cardiac: RRR; no murmurs, rubs, or gallops,no edema  Respiratory:  clear to auscultation bilaterally, normal work of breathing GI: soft, nontender, nondistended, + BS MS: no deformity or atrophy  Skin: warm and dry, no rash Neuro:  Strength and sensation are intact Psych: euthymic mood, full affect   EKG:   The ekg ordered today demonstrates NSR, septal Q waves, nonspecific ST changes   Recent Labs: No results found for requested labs within last 8760 hours.   Lipid Panel    Component Value Date/Time   CHOL  11/24/2009 2222    180        ATP III CLASSIFICATION:  <200     mg/dL   Desirable  200-239  mg/dL   Borderline High  >=240    mg/dL   High          TRIG  268 (H) 11/24/2009 2222   HDL 35 (L) 11/24/2009 2222   CHOLHDL 5.1 11/24/2009 2222   VLDL 54 (H) 11/24/2009 2222   LDLCALC  11/24/2009 2222    91        Total Cholesterol/HDL:CHD Risk Coronary Heart Disease Risk Table                     Men   Women  1/2 Average Risk   3.4   3.3  Average Risk       5.0   4.4  2 X Average Risk   9.6   7.1  3 X Average Risk  23.4   11.0        Use the calculated Patient Ratio above and the CHD Risk Table to determine the patient's CHD Risk.        ATP III CLASSIFICATION (LDL):  <100     mg/dL   Optimal  100-129  mg/dL   Near or Above                    Optimal  130-159  mg/dL   Borderline  160-189  mg/dL   High  >190     mg/dL   Very High     Other studies Reviewed: Additional studies/ records that were reviewed today with results demonstrating: 2018 echo shows Normal LV function and normal mitral valve function. .   ASSESSMENT AND PLAN:  1. S/p mechanical mitral valve: DOing well.  Tolerating anticoagulation.  No bleeding problems.   2. Hyperlipidemia/hypertriglyceridemia: Lipids reviewed from 2018 and well controlled. TG 137, LDL 86.  3. *h/o AFib: In NSR.  Coumadin or stroke prevention. 4. DOE: Chronic. No evidence of volume overload.   COntinue regular exercise.    Current medicines are reviewed at length with the patient today.  The patient concerns regarding her medicines were addressed.  The following changes have been made:  No change  Labs/ tests ordered today include:  No orders of the defined types were placed in this encounter.   Recommend 150 minutes/week of aerobic exercise Low fat, low carb, high fiber diet recommended  Disposition:   FU in 1 year   Signed, Larae Grooms, MD  06/17/2017 12:03 PM    [facility], [place], [place]  [address]  Phone: [phone]; Fax: [phone]

## 2017-06-17 ENCOUNTER — Ambulatory Visit (INDEPENDENT_AMBULATORY_CARE_PROVIDER_SITE_OTHER): Payer: Medicare Other | Admitting: Interventional Cardiology

## 2017-06-17 ENCOUNTER — Encounter: Payer: Self-pay | Admitting: Interventional Cardiology

## 2017-06-17 VITALS — BP 142/80 | HR 90 | Ht 69.0 in | Wt 148.8 lb

## 2017-06-17 DIAGNOSIS — I480 Paroxysmal atrial fibrillation: Secondary | ICD-10-CM

## 2017-06-17 DIAGNOSIS — E781 Pure hyperglyceridemia: Secondary | ICD-10-CM | POA: Diagnosis not present

## 2017-06-17 DIAGNOSIS — Z952 Presence of prosthetic heart valve: Secondary | ICD-10-CM | POA: Diagnosis not present

## 2017-06-17 DIAGNOSIS — R0602 Shortness of breath: Secondary | ICD-10-CM

## 2017-06-17 NOTE — Patient Instructions (Signed)

## 2017-07-13 DIAGNOSIS — M810 Age-related osteoporosis without current pathological fracture: Secondary | ICD-10-CM | POA: Diagnosis not present

## 2017-07-23 ENCOUNTER — Ambulatory Visit (INDEPENDENT_AMBULATORY_CARE_PROVIDER_SITE_OTHER): Payer: Medicare Other | Admitting: *Deleted

## 2017-07-23 DIAGNOSIS — I059 Rheumatic mitral valve disease, unspecified: Secondary | ICD-10-CM | POA: Diagnosis not present

## 2017-07-23 DIAGNOSIS — Z7901 Long term (current) use of anticoagulants: Secondary | ICD-10-CM | POA: Diagnosis not present

## 2017-07-23 LAB — POCT INR: INR: 2.1

## 2017-07-23 NOTE — Patient Instructions (Signed)
Description   Today take 2.5 tablets then coumadin, then resume same dosage 1 tablet daily then 2 tablets on Tuesdays. Recheck INR in 3 weeks.

## 2017-08-01 ENCOUNTER — Other Ambulatory Visit: Payer: Self-pay | Admitting: Interventional Cardiology

## 2017-08-18 ENCOUNTER — Ambulatory Visit (INDEPENDENT_AMBULATORY_CARE_PROVIDER_SITE_OTHER): Payer: Medicare Other | Admitting: Pharmacist

## 2017-08-18 DIAGNOSIS — Z7901 Long term (current) use of anticoagulants: Secondary | ICD-10-CM

## 2017-08-18 DIAGNOSIS — I059 Rheumatic mitral valve disease, unspecified: Secondary | ICD-10-CM

## 2017-08-18 LAB — POCT INR: INR: 2.1

## 2017-08-18 NOTE — Patient Instructions (Signed)
Description   Today take 2 tablets, then resume same dosage 1 tablet daily then 2 tablets on Tuesdays and Fridays. Recheck INR in 3 weeks.

## 2017-09-13 ENCOUNTER — Ambulatory Visit (INDEPENDENT_AMBULATORY_CARE_PROVIDER_SITE_OTHER): Payer: Medicare Other | Admitting: Pharmacist

## 2017-09-13 DIAGNOSIS — I059 Rheumatic mitral valve disease, unspecified: Secondary | ICD-10-CM

## 2017-09-13 DIAGNOSIS — Z7901 Long term (current) use of anticoagulants: Secondary | ICD-10-CM

## 2017-09-13 LAB — POCT INR: INR: 2.1

## 2017-09-13 NOTE — Patient Instructions (Signed)
Description   Today take 2 tablets, then change dose to 1 tablet daily then 2 tablets on Tuesdays  Thursdays  and Saturdays. Recheck INR in 3 weeks.

## 2017-09-15 DIAGNOSIS — F322 Major depressive disorder, single episode, severe without psychotic features: Secondary | ICD-10-CM | POA: Diagnosis not present

## 2017-09-15 DIAGNOSIS — I270 Primary pulmonary hypertension: Secondary | ICD-10-CM | POA: Diagnosis not present

## 2017-09-15 DIAGNOSIS — M810 Age-related osteoporosis without current pathological fracture: Secondary | ICD-10-CM | POA: Diagnosis not present

## 2017-09-15 DIAGNOSIS — I639 Cerebral infarction, unspecified: Secondary | ICD-10-CM | POA: Diagnosis not present

## 2017-09-15 DIAGNOSIS — I482 Chronic atrial fibrillation: Secondary | ICD-10-CM | POA: Diagnosis not present

## 2017-09-15 DIAGNOSIS — E039 Hypothyroidism, unspecified: Secondary | ICD-10-CM | POA: Diagnosis not present

## 2017-09-15 DIAGNOSIS — J309 Allergic rhinitis, unspecified: Secondary | ICD-10-CM | POA: Diagnosis not present

## 2017-09-15 DIAGNOSIS — I739 Peripheral vascular disease, unspecified: Secondary | ICD-10-CM | POA: Diagnosis not present

## 2017-09-25 ENCOUNTER — Emergency Department (HOSPITAL_COMMUNITY)
Admission: EM | Admit: 2017-09-25 | Discharge: 2017-09-26 | Disposition: A | Discharge status: Home / Self Care | Payer: Medicare Other | Attending: Emergency Medicine | Admitting: Emergency Medicine

## 2017-09-25 ENCOUNTER — Encounter (HOSPITAL_COMMUNITY): Payer: Self-pay | Admitting: Emergency Medicine

## 2017-09-25 ENCOUNTER — Emergency Department (HOSPITAL_COMMUNITY): Payer: Medicare Other

## 2017-09-25 DIAGNOSIS — Z79899 Other long term (current) drug therapy: Secondary | ICD-10-CM | POA: Insufficient documentation

## 2017-09-25 DIAGNOSIS — F10920 Alcohol use, unspecified with intoxication, uncomplicated: Secondary | ICD-10-CM | POA: Insufficient documentation

## 2017-09-25 DIAGNOSIS — E039 Hypothyroidism, unspecified: Secondary | ICD-10-CM | POA: Insufficient documentation

## 2017-09-25 DIAGNOSIS — Z5181 Encounter for therapeutic drug level monitoring: Secondary | ICD-10-CM | POA: Diagnosis not present

## 2017-09-25 DIAGNOSIS — R42 Dizziness and giddiness: Secondary | ICD-10-CM | POA: Insufficient documentation

## 2017-09-25 DIAGNOSIS — S0990XA Unspecified injury of head, initial encounter: Secondary | ICD-10-CM | POA: Diagnosis not present

## 2017-09-25 DIAGNOSIS — F419 Anxiety disorder, unspecified: Secondary | ICD-10-CM | POA: Diagnosis not present

## 2017-09-25 DIAGNOSIS — Z7901 Long term (current) use of anticoagulants: Secondary | ICD-10-CM | POA: Diagnosis not present

## 2017-09-25 LAB — COMPREHENSIVE METABOLIC PANEL
ALT: 50 U/L (ref 14–54)
AST: 64 U/L — ABNORMAL HIGH (ref 15–41)
Albumin: 4.4 g/dL (ref 3.5–5.0)
Alkaline Phosphatase: 73 U/L (ref 38–126)
Anion gap: 12 (ref 5–15)
BUN: 13 mg/dL (ref 6–20)
CO2: 20 mmol/L — ABNORMAL LOW (ref 22–32)
Calcium: 9.1 mg/dL (ref 8.9–10.3)
Chloride: 102 mmol/L (ref 101–111)
Creatinine, Ser: 0.89 mg/dL (ref 0.44–1.00)
GFR calc Af Amer: >60 mL/min (ref >60)
GFR calc non Af Amer: >60 mL/min (ref >60)
Glucose, Bld: 113 mg/dL — ABNORMAL HIGH (ref 65–99)
Potassium: 3.7 mmol/L (ref 3.5–5.1)
Sodium: 134 mmol/L — ABNORMAL LOW (ref 135–145)
Total Bilirubin: 1 mg/dL (ref 0.3–1.2)
Total Protein: 7.4 g/dL (ref 6.5–8.1)

## 2017-09-25 LAB — RAPID URINE DRUG SCREEN, HOSP PERFORMED
Amphetamines: NOT DETECTED
Barbiturates: NOT DETECTED
Benzodiazepines: NOT DETECTED
Cocaine: NOT DETECTED
Opiates: NOT DETECTED
Tetrahydrocannabinol: NOT DETECTED

## 2017-09-25 LAB — CBC
HCT: 40.5 % (ref 36.0–46.0)
Hemoglobin: 13.6 g/dL (ref 12.0–15.0)
MCH: 27.9 pg (ref 26.0–34.0)
MCHC: 33.6 g/dL (ref 30.0–36.0)
MCV: 83 fL (ref 78.0–100.0)
Platelets: 240 10*3/uL (ref 150–400)
RBC: 4.88 MIL/uL (ref 3.87–5.11)
RDW: 12.9 % (ref 11.5–15.5)
WBC: 6.9 10*3/uL (ref 4.0–10.5)

## 2017-09-25 LAB — ETHANOL: Alcohol, Ethyl (B): 117 mg/dL — ABNORMAL HIGH (ref <10)

## 2017-09-25 NOTE — ED Notes (Signed)
Per family; patient had a recent fall and is on blood thinners. [place] notified.

## 2017-09-25 NOTE — ED Triage Notes (Signed)
Brought by ems after being called out for anxiety.  Patient found irritable with alcohol and drugs on board per patient.  After getting on the stretcher fighting.  Was given haldol 2.5mg  IM.  Per ems daughter was giving patient drugs and alcohol.  Son upset and discussed getting IVC on patient.  Initially had symptoms of a panic attack.

## 2017-09-26 DIAGNOSIS — S0990XA Unspecified injury of head, initial encounter: Secondary | ICD-10-CM | POA: Diagnosis not present

## 2017-09-26 DIAGNOSIS — F10920 Alcohol use, unspecified with intoxication, uncomplicated: Secondary | ICD-10-CM | POA: Diagnosis not present

## 2017-09-26 LAB — PROTIME-INR
INR: 1.21
Prothrombin Time: 15.2 seconds (ref 11.4–15.2)

## 2017-09-26 NOTE — ED Notes (Signed)
In CT at this time

## 2017-09-26 NOTE — ED Notes (Signed)
Ambulated to bathroom at this time.  Steady gait noted.  States I'm ready to go.  Encouraged to stay for test results.  Lying on stretcher chair at this time.

## 2017-09-26 NOTE — ED Provider Notes (Signed)
[facility] EMERGENCY DEPARTMENT Provider Note   CSN: [id number] Arrival date & time: 09/25/17  2251     History   Chief Complaint Chief Complaint  Patient presents with  . Alcohol Intoxication    HPI Jasmine Kelley is a 72 y.o. female.  The history is provided by the patient and a relative.  Alcohol Intoxication  This is a new problem. The problem has been gradually improving. Pertinent negatives include no chest pain and no headaches. Nothing aggravates the symptoms. Nothing relieves the symptoms.  Patient presents for alcohol intoxication.  Apparently patient was at a party tonight drinking alcohol and became anxious and agitated.  Family was concerned and called EMS.  Due to agitation, patient was given Haldol in route. Family was very concerned at first about her alcohol use, but at this time they feel comfortable taking patient home.  Patient reports she feels improved.  She reports she feels safe at home She has had recent falls, but none today.  Past Medical History:  Diagnosis Date  . A-fib ([place])   . Allergic rhinitis   . Anxiety   . Bell's palsy   . Cerebellar stroke ([place])    11/11/09  . Chronic anticoagulation    coumadin, AFIB  . Dermatitis   . 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 GI bleed   . Hypothyroidism   . Left shoulder pain   . Major depression   . Osteoporosis    BMD 2013  . Pulmonary HTN ([place])   . PVD (peripheral vascular disease) ([place])   . Rectus sheath hematoma    2005  . S/P MVR (mitral valve replacement)    a. mitral stenosis s/p mechanical MVR 2001.    Patient Active Problem List   Diagnosis Date Noted  . Unspecified hypothyroidism 12/05/2013  . Mixed hyperlipidemia 12/05/2013  . A-fib ([place])   . S/P MVR (mitral valve replacement)   . Heart valve replaced by other means 03/20/2013  . Long term current use of anticoagulant therapy 03/20/2013  . Mitral valve disorder 03/21/2008  . DYSPNEA  03/21/2008    History reviewed. No pertinent surgical history.   OB History   None      Home Medications    Prior to Admission medications   Medication Sig Start Date End Date Taking? Authorizing Provider  alendronate (FOSAMAX) 70 MG tablet Take 70 mg by mouth once a week. 01/16/15   [provider]  DULoxetine (CYMBALTA) 60 MG capsule Take 60 mg by mouth daily.  01/28/15   [provider]  ferrous fumarate (HEMOCYTE - 106 MG FE) 325 (106 FE) MG TABS tablet Take 1 tablet by mouth daily.    [provider]  LORazepam (ATIVAN) 1 MG tablet Take 0.5 tablets (0.5 mg total) by mouth every 6 (six) hours as needed for anxiety. 05/03/14   Charlesetta Shanks, MD  simvastatin (ZOCOR) 10 MG tablet Take 10 mg by mouth daily at 6 PM.  10/17/13   [provider]  SYNTHROID 100 MCG tablet  05/14/16   [provider]  valACYclovir (VALTREX) 500 MG tablet Take 500 mg by mouth daily as needed (blisters).  10/28/13   [provider]  Vitamin D, Cholecalciferol, 1000 UNITS TABS Take 1 tablet by mouth daily.    [provider]  warfarin (COUMADIN) 2 MG tablet TAKE AS DIRECTED BY COUMADIN CLINIC 08/02/17   Jettie Booze, MD    Family History Family History  Problem Relation Age of Onset  . Breast cancer Mother   . CVA Father   . Diabetes Father   . Heart attack Father   . Stroke Father   . Hypertension Neg Hx     Social History Social History   Tobacco Use  . Smoking status: Never Smoker  . Smokeless tobacco: Never Used  Substance Use Topics  . Alcohol use: Yes  . Drug use: No     Allergies   Bee venom; Benadryl [diphenhydramine hcl (sleep)]; Buspirone; Chlorpromazine hcl; Chlorpromazine hcl; Diphenhydramine hcl; Other; and Peanut butter flavor   Review of Systems Review of Systems  Constitutional: Negative for fever.  Cardiovascular: Negative for chest pain.  Gastrointestinal: Negative for vomiting.  Neurological:  Negative for headaches.  All other systems reviewed and are negative.    Physical Exam Updated Vital Signs BP (!) 146/75 (BP Location: Right Arm)   Pulse 85   Temp (!) 97.4 F (36.3 C) (Oral)   Resp 16   Ht 1.753 m (5\' 9" )   Wt 67.1 kg (148 lb)   SpO2 100%   BMI 21.86 kg/m   Physical Exam CONSTITUTIONAL: Elderly and disheveled HEAD: Normocephalic/atraumatic EYES: EOMI/PERRL ENMT: Mucous membranes moist NECK: supple no meningeal signs SPINE/BACK:entire spine nontender CV: W0/J8 noted, click noted LUNGS: Lungs are clear to auscultation bilaterally, no apparent distress ABDOMEN: soft, nontender, no rebound or guarding, bowel sounds noted throughout abdomen GU:no cva tenderness NEURO: Pt is awake/alert/appropriate, moves all extremitiesx4.  No facial droop.  GCS equals 15 EXTREMITIES: pulses normal/equal, full ROM SKIN: warm, color normal PSYCH: no abnormalities of mood noted, alert and oriented to situation   ED Treatments / Results  Labs (all labs ordered are listed, but only abnormal results are displayed) Labs Reviewed  COMPREHENSIVE METABOLIC PANEL - Abnormal; Notable for the following components:      Result Value   Sodium 134 (*)    CO2 20 (*)    Glucose, Bld 113 (*)    AST 64 (*)    All other components within normal limits  ETHANOL - Abnormal; Notable for the following components:   Alcohol, Ethyl (B) 117 (*)    All other components within normal limits  CBC  RAPID URINE DRUG SCREEN, HOSP PERFORMED  PROTIME-INR    EKG EKG Interpretation  Date/Time:  Sunday Sep 26 2017 01:21:09 EDT Ventricular Rate:  104 PR Interval:  176 QRS Duration: 64 QT Interval:  352 QTC Calculation: 462 R Axis:   79 Text Interpretation:  Sinus tachycardia Septal infarct , age undetermined Abnormal ECG Confirmed by Ripley Fraise [phone]) on 09/26/2017 1:35:35 AM   Radiology Ct Head Wo Contrast  Result Date: 09/26/2017 CLINICAL DATA:  Recent fall on blood thinners EXAM:  CT HEAD WITHOUT CONTRAST TECHNIQUE: Contiguous axial images were obtained from the base of the skull through the vertex without intravenous contrast. COMPARISON:  CT brain 05/03/2014, MRI 11/25/2009 FINDINGS: Brain: No acute territorial infarction, hemorrhage, or intracranial mass is visualized. Atrophy. Mild small vessel ischemic changes of the white matter. Vascular: No hyperdense vessels.  No unexpected calcification Skull: Normal. Negative for fracture or focal lesion. Sinuses/Orbits: Mucosal thickening in the ethmoid sinuses. No acute orbital abnormality. Other: None IMPRESSION: 1. No CT evidence for acute intracranial abnormality. 2. Atrophy and mild small vessel ischemic changes of the white matter Electronically Signed   By: Donavan Foil M.D.   On: 09/26/2017 00:21    Procedures Procedures (including critical care time)  Medications Ordered in ED Medications -  No data to display   Initial Impression / Assessment and Plan / ED Course  I have reviewed the triage vital signs and the nursing notes.  Pertinent labs & imaging results that were available during my care of the patient were reviewed by me and considered in my medical decision making (see chart for details).     In the emergency department for alcohol intoxication.  By time of my evaluation patient was awake and alert She was ambulatory without difficulty. Patient feels safe at home, and family feels comfortable taking patient home Patient appears appropriate for discharge.  I had a long discussion with patient and her family about not taking her Coumadin.  Patient has a known metallic valve, and she must continue Coumadin.  I advised her that her risk of stroke is very high.  I advised her to make sure she begins taking her medications, and call her cardiologist in 24 to 48 hours for recheck.  Final Clinical Impressions(s) / ED Diagnoses   Final diagnoses:  Alcoholic intoxication without complication ([place])  Subtherapeutic  anticoagulation    ED Discharge Orders    None       Ripley Fraise, MD 09/26/17 [phone]

## 2017-09-26 NOTE — ED Notes (Signed)
Patient Alert and oriented to baseline. Stable and ambulatory to baseline. Patient verbalized understanding of the discharge instructions.  Patient belongings were taken by the patient.   

## 2017-09-26 NOTE — Discharge Instructions (Signed)
Substance Abuse Treatment Programs ° °Intensive Outpatient Programs °[facility]     °[address]      °[place], [place]                   °[phone]      ° °[facility] °[address] °[place], [place] °[phone] ° ° Behavioral Health Outpatient     °(Inpatient and outpatient)     °[address]           °[phone]   ° °[facility] °[phone] (Suboxone and Methadone) ° °[address]      °[place], [place] [address]      °[phone]      ° °[address] °[place], [place] °852-3033 ° °[facility] (Outpatient/Inpatient, [facility])    °(insurance only) [phone]      °       °[facility] (Groups & Residential) °[place], [place] °[phone] ° °   °[facility]     °[address]     °[place], [place]      °[phone]      ° °[facility] (for caregivers and family) °[address] °[place], [place] °336-299-4655 ° ° ° ° ° °Residential Treatment Programs °[facility]      °[address], [place], [place] [address]  °[phone]      ° °[facility] °[address] [place], [place] [address] °[phone] ° °[facility]        °336-248-8914      ° °[facility] °[phone] ° °[facility]             °[address]                                         °[place], [place]                                                °877-615-2722 or 336-784-9470                              ° °[facility] [place] °[address] °[place], [address] °[phone] ° °[facility]    °[address]      °[place], [place]     °[phone]      ° °[facility] °[address] °[place], [place] °[phone] ° °[facility]   °[address]     °[place], [place] [address]     °[phone]      °Admissions: 8am-3pm M-F ° °[facility] °[address] °[place],  [place] °336-227-7417 ° °[facility]: Residential Program (90 Days)   °[place], [place]      °336-725-8389 or 800-758-6077    ° °[facility] °[place], [place] °(Walk in Hours over the weekend or by referral) ° °[facility] °[address], [place], [place] [address] °([phone] ° °Crisis Mobile: [facility]:  1-877-626-1772 (for crisis response 24 hours a day) °[facility] Hotline:      1-800-256-2452 °Outpatient Psychiatry and Counseling ° °[facility]: Mobile Crisis   Management 24 hours:  1-877-626-1772 ° °[facility] sliding scale fee and walk in schedule: M-F 8am-12pm/1pm-3pm °[address]  °[place], [place] [address] °[phone] ° °[facility] °[address] °[place], [place] [address] °336-703-9650 ° °[facility] (Formerly known [facility])- new patient walk-in appointments available Monday - Friday 8am -3pm.          °[address] °[place], [place] [address] °336-676-6840 or crisis line- 336-676-6905 ° °[facility] Intensive Outpatient Therapy Program °[address] °[place], [place] [address] °336-832-9804 ° °[facility]                  °Crisis Services      °[phone]      °[address]     °[place], [place] [address]                ° °[facility]   °[facility] °[phone] °[address] °[place], [place] [address] ° ° °[facility]          °[address]  °[place], [place] [address]       °[phone] ° °[facility] °[address] °[place], [place] [address] °336-292-1510 ° °[facility]    °[address]    °[place], [place] [address]     °[phone]      ° °Parish McKinney, MD     °[address]     °[place] [address]     °336-282-1251     °  °[facility] °[address] °[place] [address] ° °[facility]     °[address]     °[place], [place]      °336-542-2076      ° °[facility] °Shamsher Ahluwalia, MD °[address] °[place], [place] [address] °[phone] ° °[facility]     °[address]     °[place], [place] [address]     °336-274-1237      ° °[facility] °[address] °[place], [place] [address] °336-854-4450 °Resources for Temporary Residential Assistance/Crisis Centers ° °DAY CENTERS °[facility] °M-F 8am-3pm   °[address] [place], [place] [address]   336-332-0824 °Services include: laundry, barbering, support groups, case management, phone  & computer access, showers, AA/NA mtgs, mental health/substance abuse nurse, job skills class, disability information, VA assistance, spiritual classes, etc.  ° °HOMELESS SHELTERS ° °[facility]     °[facility]   °[address], [place]     °[phone]       °       °[facility] (women and children)       °[address] °[place], [place] [address] °336-275-0820 °[email or web address] for application and process °Application Required ° °[facility]   °[address]    °[place]  [address]     °[phone]       °             °[facility] °[address] °,  [address] °336.273.5572 °336-235-0363(schedule application appt.) °Application Required ° °[facility] (women only)    °[address]     °[place],  [address]     °336-884-1039      °  Intake starts 6pm daily °Need valid ID, SSC, & Police report °[facility] [place] °[address] °[place], [place] °336-881-5420 °Application Required ° °[facility] (men only)     °[address]      °[place], [place]     °[phone]      ° °[facility] °(Pregnant women only) °[address] °, [place] °336-275-0206 ° °[facility]      °[address]      °[place], [place] [address]     °[phone]      °       °[facility] °[address] °[place], [place] °336-723-1848 °90 day commitment/SA/Application process ° °[facility](men only)     °[address]     °[place], [place]     °[phone]       °Check-in at 7pm     °       °[facility] °[address] °[place], [place] [address] °336-248-6684 °Men/Women/Women and Children must be there by 7 pm ° °[facility] °[place], [place] °336-722-8721                ° °

## 2017-10-06 ENCOUNTER — Ambulatory Visit (INDEPENDENT_AMBULATORY_CARE_PROVIDER_SITE_OTHER): Payer: Medicare Other | Admitting: *Deleted

## 2017-10-06 DIAGNOSIS — Z952 Presence of prosthetic heart valve: Secondary | ICD-10-CM | POA: Diagnosis not present

## 2017-10-06 DIAGNOSIS — I059 Rheumatic mitral valve disease, unspecified: Secondary | ICD-10-CM | POA: Diagnosis not present

## 2017-10-06 DIAGNOSIS — Z5181 Encounter for therapeutic drug level monitoring: Secondary | ICD-10-CM | POA: Diagnosis not present

## 2017-10-06 DIAGNOSIS — Z7901 Long term (current) use of anticoagulants: Secondary | ICD-10-CM | POA: Diagnosis not present

## 2017-10-06 LAB — POCT INR: INR: 2.1 (ref 2.0–3.0)

## 2017-10-06 NOTE — Patient Instructions (Signed)
Description   Today June 5th take 1 and 1/2 tablets then change dose of coumadin to 2 tablets daily except 1 tablet on Mondays Wednesdays and Fridays Recheck INR in 2 weeks.

## 2017-11-18 ENCOUNTER — Ambulatory Visit (INDEPENDENT_AMBULATORY_CARE_PROVIDER_SITE_OTHER): Payer: Medicare Other | Admitting: *Deleted

## 2017-11-18 DIAGNOSIS — I059 Rheumatic mitral valve disease, unspecified: Secondary | ICD-10-CM | POA: Diagnosis not present

## 2017-11-18 DIAGNOSIS — Z7901 Long term (current) use of anticoagulants: Secondary | ICD-10-CM

## 2017-11-18 LAB — POCT INR: INR: 2.5 (ref 2.0–3.0)

## 2017-11-18 NOTE — Patient Instructions (Signed)
Description   Continue taking  2 tablets daily except 1 tablet on Mondays Wednesdays and Fridays Recheck INR in 4 weeks.

## 2017-12-07 DIAGNOSIS — Z7901 Long term (current) use of anticoagulants: Secondary | ICD-10-CM | POA: Diagnosis not present

## 2017-12-07 DIAGNOSIS — T63441A Toxic effect of venom of bees, accidental (unintentional), initial encounter: Secondary | ICD-10-CM | POA: Diagnosis not present

## 2017-12-16 ENCOUNTER — Ambulatory Visit (INDEPENDENT_AMBULATORY_CARE_PROVIDER_SITE_OTHER): Payer: Medicare Other

## 2017-12-16 DIAGNOSIS — Z7901 Long term (current) use of anticoagulants: Secondary | ICD-10-CM | POA: Diagnosis not present

## 2017-12-16 DIAGNOSIS — I059 Rheumatic mitral valve disease, unspecified: Secondary | ICD-10-CM | POA: Diagnosis not present

## 2017-12-16 LAB — POCT INR: INR: 3 (ref 2.0–3.0)

## 2017-12-16 NOTE — Patient Instructions (Addendum)
Continue taking 2 tablets daily except 1 tablet on Mondays Wednesdays and Fridays.  Recheck INR in 4 weeks.

## 2017-12-28 ENCOUNTER — Other Ambulatory Visit: Payer: Self-pay | Admitting: Interventional Cardiology

## 2018-01-07 DIAGNOSIS — N390 Urinary tract infection, site not specified: Secondary | ICD-10-CM | POA: Diagnosis not present

## 2018-02-17 ENCOUNTER — Ambulatory Visit (INDEPENDENT_AMBULATORY_CARE_PROVIDER_SITE_OTHER): Payer: Medicare Other | Admitting: *Deleted

## 2018-02-17 DIAGNOSIS — I059 Rheumatic mitral valve disease, unspecified: Secondary | ICD-10-CM

## 2018-02-17 DIAGNOSIS — Z7901 Long term (current) use of anticoagulants: Secondary | ICD-10-CM | POA: Diagnosis not present

## 2018-02-17 LAB — PROTIME-INR
INR: 8.5 (ref 0.8–1.2)
Prothrombin Time: 79.5 s — ABNORMAL HIGH (ref 9.1–12.0)

## 2018-02-17 LAB — POCT INR: INR: 7.4 — AB (ref 2.0–3.0)

## 2018-02-24 ENCOUNTER — Ambulatory Visit (INDEPENDENT_AMBULATORY_CARE_PROVIDER_SITE_OTHER): Payer: Medicare Other | Admitting: *Deleted

## 2018-02-24 DIAGNOSIS — I059 Rheumatic mitral valve disease, unspecified: Secondary | ICD-10-CM

## 2018-02-24 DIAGNOSIS — Z7901 Long term (current) use of anticoagulants: Secondary | ICD-10-CM

## 2018-02-24 LAB — POCT INR: INR: 1.6 — AB (ref 2.0–3.0)

## 2018-02-24 NOTE — Patient Instructions (Signed)
Description   Today take 3 tablets, then continue taking 2 tablets daily except 1 tablet on Mondays, Wednesdays and Fridays. Recheck in 12 days. Call us with medication changes [phone].

## 2018-03-08 ENCOUNTER — Ambulatory Visit (INDEPENDENT_AMBULATORY_CARE_PROVIDER_SITE_OTHER): Payer: Medicare Other | Admitting: *Deleted

## 2018-03-08 DIAGNOSIS — I059 Rheumatic mitral valve disease, unspecified: Secondary | ICD-10-CM | POA: Diagnosis not present

## 2018-03-08 DIAGNOSIS — Z7901 Long term (current) use of anticoagulants: Secondary | ICD-10-CM | POA: Diagnosis not present

## 2018-03-08 LAB — POCT INR: INR: 2.4 (ref 2.0–3.0)

## 2018-03-08 NOTE — Patient Instructions (Signed)
Description   Today take 2.5 tablets, then continue taking 2 tablets daily except 1 tablet on Mondays, Wednesdays and Fridays. Recheck in 2 weeks. Call us with medication changes [phone].

## 2018-05-11 ENCOUNTER — Other Ambulatory Visit: Payer: Self-pay | Admitting: Interventional Cardiology

## 2018-05-12 ENCOUNTER — Telehealth: Payer: Self-pay

## 2018-05-12 NOTE — Telephone Encounter (Signed)
Called pt to let her know that I cannot refill until she gets scheduled for appt and will only be able to give her what she needs to get her to the appt

## 2018-05-12 NOTE — Telephone Encounter (Signed)
Called pt left msg stating that in order to refill coumadin I have to have her scheduled and giver her just the amount that she needs to get her up to her appt

## 2018-05-17 DIAGNOSIS — R350 Frequency of micturition: Secondary | ICD-10-CM | POA: Diagnosis not present

## 2018-05-24 NOTE — Telephone Encounter (Signed)
Appt scheduled for 1/23

## 2018-05-26 ENCOUNTER — Other Ambulatory Visit: Payer: Self-pay | Admitting: *Deleted

## 2018-05-26 ENCOUNTER — Ambulatory Visit (INDEPENDENT_AMBULATORY_CARE_PROVIDER_SITE_OTHER): Payer: Medicare Other | Admitting: *Deleted

## 2018-05-26 DIAGNOSIS — Z7901 Long term (current) use of anticoagulants: Secondary | ICD-10-CM

## 2018-05-26 DIAGNOSIS — I059 Rheumatic mitral valve disease, unspecified: Secondary | ICD-10-CM | POA: Diagnosis not present

## 2018-05-26 LAB — POCT INR: INR: 1.9 — AB (ref 2.0–3.0)

## 2018-05-26 MED ORDER — WARFARIN SODIUM 2 MG PO TABS: ORAL_TABLET | ORAL | 0 refills | Status: DC

## 2018-05-26 NOTE — Patient Instructions (Signed)
Description   Today take 2.5 tablets, then continue taking 2 tablets daily except 1 tablet on Mondays, Wednesdays and Fridays. Recheck in 3 weeks. Call us with medication changes [phone].

## 2018-06-27 ENCOUNTER — Other Ambulatory Visit: Payer: Self-pay | Admitting: Interventional Cardiology

## 2018-06-30 ENCOUNTER — Ambulatory Visit (INDEPENDENT_AMBULATORY_CARE_PROVIDER_SITE_OTHER): Payer: Medicare Other | Admitting: *Deleted

## 2018-06-30 DIAGNOSIS — I059 Rheumatic mitral valve disease, unspecified: Secondary | ICD-10-CM

## 2018-06-30 DIAGNOSIS — Z7901 Long term (current) use of anticoagulants: Secondary | ICD-10-CM | POA: Diagnosis not present

## 2018-06-30 LAB — POCT INR: INR: 4.2 — AB (ref 2.0–3.0)

## 2018-06-30 NOTE — Patient Instructions (Addendum)
Description   Do not take Coumadin today and take 1/2 tablet tomorrow then continue taking 2 tablets daily except 1 tablet on Mondays, Wednesdays and Fridays. Recheck in 2 weeks. Call us with medication changes [phone].

## 2018-07-12 DIAGNOSIS — J309 Allergic rhinitis, unspecified: Secondary | ICD-10-CM | POA: Diagnosis not present

## 2018-07-14 ENCOUNTER — Other Ambulatory Visit: Payer: Self-pay

## 2018-07-14 ENCOUNTER — Ambulatory Visit (INDEPENDENT_AMBULATORY_CARE_PROVIDER_SITE_OTHER): Payer: Medicare Other | Admitting: Pharmacist

## 2018-07-14 DIAGNOSIS — Z7901 Long term (current) use of anticoagulants: Secondary | ICD-10-CM | POA: Diagnosis not present

## 2018-07-14 DIAGNOSIS — I059 Rheumatic mitral valve disease, unspecified: Secondary | ICD-10-CM

## 2018-07-14 LAB — POCT INR: INR: 2 (ref 2.0–3.0)

## 2018-07-14 NOTE — Patient Instructions (Signed)
Description   Take 2.5 tablets today then continue taking 2 tablets daily except 1 tablet on Mondays, Wednesdays and Fridays. Recheck in 2 weeks. Call us with medication changes [phone].

## 2018-07-19 DIAGNOSIS — R7309 Other abnormal glucose: Secondary | ICD-10-CM | POA: Diagnosis not present

## 2018-07-19 DIAGNOSIS — I739 Peripheral vascular disease, unspecified: Secondary | ICD-10-CM | POA: Diagnosis not present

## 2018-07-19 DIAGNOSIS — E039 Hypothyroidism, unspecified: Secondary | ICD-10-CM | POA: Diagnosis not present

## 2018-07-19 DIAGNOSIS — Z1389 Encounter for screening for other disorder: Secondary | ICD-10-CM | POA: Diagnosis not present

## 2018-07-19 DIAGNOSIS — I639 Cerebral infarction, unspecified: Secondary | ICD-10-CM | POA: Diagnosis not present

## 2018-07-19 DIAGNOSIS — E782 Mixed hyperlipidemia: Secondary | ICD-10-CM | POA: Diagnosis not present

## 2018-07-19 DIAGNOSIS — Z1159 Encounter for screening for other viral diseases: Secondary | ICD-10-CM | POA: Diagnosis not present

## 2018-07-19 DIAGNOSIS — I482 Chronic atrial fibrillation, unspecified: Secondary | ICD-10-CM | POA: Diagnosis not present

## 2018-07-19 DIAGNOSIS — Z0000 Encounter for general adult medical examination without abnormal findings: Secondary | ICD-10-CM | POA: Diagnosis not present

## 2018-07-19 DIAGNOSIS — I270 Primary pulmonary hypertension: Secondary | ICD-10-CM | POA: Diagnosis not present

## 2018-07-19 DIAGNOSIS — F322 Major depressive disorder, single episode, severe without psychotic features: Secondary | ICD-10-CM | POA: Diagnosis not present

## 2018-07-19 DIAGNOSIS — Z1211 Encounter for screening for malignant neoplasm of colon: Secondary | ICD-10-CM | POA: Diagnosis not present

## 2018-07-19 DIAGNOSIS — J309 Allergic rhinitis, unspecified: Secondary | ICD-10-CM | POA: Diagnosis not present

## 2018-07-19 DIAGNOSIS — M810 Age-related osteoporosis without current pathological fracture: Secondary | ICD-10-CM | POA: Diagnosis not present

## 2018-07-27 ENCOUNTER — Telehealth: Payer: Self-pay

## 2018-07-27 NOTE — Telephone Encounter (Signed)
LMOM FOR PRESCREEN AND DRIVE THRU 

## 2018-07-29 ENCOUNTER — Telehealth: Payer: Self-pay | Admitting: Pharmacist

## 2018-07-29 ENCOUNTER — Other Ambulatory Visit: Payer: Self-pay | Admitting: Interventional Cardiology

## 2018-07-29 NOTE — Telephone Encounter (Signed)
1. Do you currently have a fever? no (yes = cancel and refer to pcp for e-visit) 2. Have you recently travelled on a cruise, internationally, or to [place], [place], [place], [place], [place], or [place], [place] [employer]) ? no (yes = cancel, stay home, monitor symptoms, and contact pcp or initiate e-visit if symptoms develop) 3. Have you been in contact with someone that is currently pending confirmation of Covid19 testing or has been confirmed to have the [place] virus?  no (yes = cancel, stay home, away from tested individual, monitor symptoms, and contact pcp or initiate e-visit if symptoms develop) 4. Are you currently experiencing fatigue or cough? no (yes = pt should be prepared to have a mask placed at the time of their visit).  Pt. Advised that we are restricting visitors at this time and anyone present in the vehicle should meet the above criteria as well. Advised that visit will be at curbside for finger stick ONLY and will receive call with instructions. Pt also advised to please bring own pen for signature of arrival document.   Patient daughter will be brining her- she has answered no to all questions above

## 2018-07-29 NOTE — Telephone Encounter (Signed)
Pt supposed to come for appointment today authorize after

## 2018-07-29 NOTE — Telephone Encounter (Signed)
Pt has appt later today wait until after seen to authorize

## 2018-08-01 ENCOUNTER — Ambulatory Visit (INDEPENDENT_AMBULATORY_CARE_PROVIDER_SITE_OTHER): Payer: Medicare Other | Admitting: Pharmacist

## 2018-08-01 DIAGNOSIS — I059 Rheumatic mitral valve disease, unspecified: Secondary | ICD-10-CM

## 2018-08-01 DIAGNOSIS — Z7901 Long term (current) use of anticoagulants: Secondary | ICD-10-CM

## 2018-08-01 LAB — POCT INR: INR: 2.6 (ref 2.0–3.0)

## 2018-08-02 ENCOUNTER — Telehealth: Payer: Self-pay | Admitting: *Deleted

## 2018-08-02 NOTE — Telephone Encounter (Addendum)
Patient INR was checked yesterday through our drive up service and pt was told we would call her to discuss details and next appt. We have tried calling the pt and dtr Jasmine Kelley multiple time without success. Please refer to encounter in the call section for more details. Called the other dtr listed, Jasmine Kelley, and she took a msg to have the pt call us back regarding her visit on yesterday. She stated she would get pt & Jasmine Kelley the message. Gave her the office number of [phone].  Dtr Jasmine Kelley called back and the Anticoagulation Encounter from yesterday was taking care of and next appt set.

## 2018-08-12 ENCOUNTER — Other Ambulatory Visit: Payer: Self-pay | Admitting: Interventional Cardiology

## 2018-08-23 ENCOUNTER — Telehealth: Payer: Self-pay

## 2018-08-23 NOTE — Telephone Encounter (Signed)
lmom for prescreen  

## 2018-09-10 ENCOUNTER — Other Ambulatory Visit: Payer: Self-pay | Admitting: Interventional Cardiology

## 2018-09-12 NOTE — Telephone Encounter (Signed)
Pt is overdue for INR follow up. Called pt and there was no answer but I did leave a message for her to call [facility] back.

## 2018-09-19 ENCOUNTER — Telehealth: Payer: Self-pay | Admitting: *Deleted

## 2018-09-19 NOTE — Telephone Encounter (Signed)
LMOM for pts daughter that we are having a drive up clinic and her mom is past due for appt and we need an INR for safety before filling Rx so please call office asap and schedule an appt.

## 2018-09-21 NOTE — Telephone Encounter (Signed)
LMOM again to follow up.

## 2018-09-23 NOTE — Telephone Encounter (Signed)
LMOM again. 

## 2018-09-28 NOTE — Telephone Encounter (Signed)
LMOM again. 

## 2018-09-29 NOTE — Telephone Encounter (Signed)
Called the pt again and left a voicemail stating that she needs to call us back regarding the refill on her warfarin and that we are not able to fill because she is overdue for an appt and the medication level has to be monitored, if we do not hear from her today we will deny the refill and await for her to call us back when she is ready to schedule an appt. Also, advised we are operating on limited hours so it's imperative to call back soon so we can see when we can fit her into our schedule.

## 2018-09-30 ENCOUNTER — Telehealth: Payer: Self-pay | Admitting: *Deleted

## 2018-09-30 NOTE — Telephone Encounter (Addendum)
Called pt before denying refill and had to leave a message. Will deny refill as stated in the message: in the message stated that I was calling from [facility] and unable to complete her request for her Coumadin refill because she needs an appointment and we need her to call back regarding this, advised the refill has been denied and to call back once she is available to schedule an appointment at [phone].  Spoke with pharmacy as well and they state they are aware that pt needs an appt before we will refill anymore of Coumadin.

## 2018-09-30 NOTE — Telephone Encounter (Signed)
Called pt before denying refill and had to leave a message. Pt has had the refill pending since 09/10/2018 (on a weekend and first documentation on 09/12/2018-first business day), please refer to refill encounter for details of multiple calls.  Will deny refill as stated in the message: the message stated that I was calling from [facility] and unable to complete her request for her Coumadin refill because she needs an appointment and we need her to call back regarding this, advised the refill has been denied and to call back once she is available to schedule an appointment at [phone].

## 2018-10-17 DIAGNOSIS — E039 Hypothyroidism, unspecified: Secondary | ICD-10-CM | POA: Diagnosis not present

## 2018-11-04 ENCOUNTER — Other Ambulatory Visit: Payer: Self-pay | Admitting: Interventional Cardiology

## 2018-11-07 NOTE — Telephone Encounter (Signed)
Prescription refill request received for coumadin. Pt is overdue to have her INR checked. Called pt and left a message for her to call back so she can schedule an appointment to get her INR checked.

## 2018-11-10 NOTE — Telephone Encounter (Signed)
Called pt as she is overdue for INR check, No voicemail came on so unable to leave a message, will try back later.

## 2018-11-10 NOTE — Telephone Encounter (Signed)
Called pt and messages states that the person cannot take calls at this time and then the busy signal comes on.  Called her Dtr-Lorie's number and mailbox is full and unable to leave a message.

## 2018-11-11 NOTE — Telephone Encounter (Signed)
Called pt again to make an appt for INR recheck. Unable to reach and no VM available, Called Dtr number and VM is not set up.

## 2018-11-11 NOTE — Telephone Encounter (Signed)
Called pt again as she is overdue for an appt; the number states the pt cannot receive calls at this time so unable to leave a voicemail. Called her dtr Dory Larsen and left a message to have pt call back and that she needs to schedule an appointment to have INR checked.

## 2018-11-14 NOTE — Telephone Encounter (Signed)
Called the pt and had to leave a message stating to call back to schedule an appt to have her INR rechecked. Left the [facility] number to call us back to schedule an appt. Will deny this refill at this time as we have tried to get in contact with the patient and her dtr Lorie multiple times without success.

## 2018-12-12 ENCOUNTER — Telehealth: Payer: Self-pay

## 2018-12-12 NOTE — Telephone Encounter (Signed)
lmom for overdue inr 

## 2019-01-23 DIAGNOSIS — E039 Hypothyroidism, unspecified: Secondary | ICD-10-CM | POA: Diagnosis not present

## 2019-01-23 DIAGNOSIS — I639 Cerebral infarction, unspecified: Secondary | ICD-10-CM | POA: Diagnosis not present

## 2019-01-23 DIAGNOSIS — E782 Mixed hyperlipidemia: Secondary | ICD-10-CM | POA: Diagnosis not present

## 2019-01-23 DIAGNOSIS — I482 Chronic atrial fibrillation, unspecified: Secondary | ICD-10-CM | POA: Diagnosis not present

## 2019-01-23 DIAGNOSIS — I270 Primary pulmonary hypertension: Secondary | ICD-10-CM | POA: Diagnosis not present

## 2019-01-23 DIAGNOSIS — Z7901 Long term (current) use of anticoagulants: Secondary | ICD-10-CM | POA: Diagnosis not present

## 2019-01-23 DIAGNOSIS — F322 Major depressive disorder, single episode, severe without psychotic features: Secondary | ICD-10-CM | POA: Diagnosis not present

## 2019-01-23 DIAGNOSIS — J309 Allergic rhinitis, unspecified: Secondary | ICD-10-CM | POA: Diagnosis not present

## 2019-01-23 DIAGNOSIS — M810 Age-related osteoporosis without current pathological fracture: Secondary | ICD-10-CM | POA: Diagnosis not present

## 2019-01-23 DIAGNOSIS — Z23 Encounter for immunization: Secondary | ICD-10-CM | POA: Diagnosis not present

## 2019-01-23 LAB — POCT INR

## 2019-01-24 ENCOUNTER — Telehealth: Payer: Self-pay | Admitting: *Deleted

## 2019-01-24 NOTE — Telephone Encounter (Signed)
Received fax from [facility] stating that pt needs to come in next week for an INR check. Called pt and left a message for pt call back to schedule visit with the [facility] Clinic.

## 2019-01-27 NOTE — Telephone Encounter (Signed)
Called and left message for pt to call [facility] clinic back [phone].

## 2019-01-30 ENCOUNTER — Other Ambulatory Visit: Payer: Self-pay | Admitting: Interventional Cardiology

## 2019-01-30 NOTE — Telephone Encounter (Signed)
Refused Warfarin refill at pharmacy with note to have pt call office for appt, she is overdue for follow-up.  Last INR check was in March 2020, we have attempted to contact pt multiple times to schedule follow-up see documentation in Epic 3/31, 5/18, 5/29, 8/10, 9/22. Awaiting pt call back for appt.

## 2019-02-03 NOTE — Telephone Encounter (Signed)
Called pt and had to leave a message stating for her to call back regarding an INR check. Left our direct number to call back.

## 2019-06-01 DIAGNOSIS — N3946 Mixed incontinence: Secondary | ICD-10-CM | POA: Diagnosis not present

## 2019-06-01 DIAGNOSIS — N390 Urinary tract infection, site not specified: Secondary | ICD-10-CM | POA: Diagnosis not present

## 2019-06-22 ENCOUNTER — Telehealth: Payer: Self-pay | Admitting: *Deleted

## 2019-06-22 NOTE — Telephone Encounter (Signed)
Called pt since she is in our overdue box. Pt was last seen on 08/01/2018 by our facility and we have called her multiple times regarding making an appt or updating [place] if she is still taking Warfarin. Left her a message to call back the Anticoagulation Clinic at [phone].

## 2019-07-19 ENCOUNTER — Ambulatory Visit (INDEPENDENT_AMBULATORY_CARE_PROVIDER_SITE_OTHER): Payer: Medicare Other | Admitting: *Deleted

## 2019-07-19 ENCOUNTER — Other Ambulatory Visit: Payer: Self-pay

## 2019-07-19 DIAGNOSIS — Z5181 Encounter for therapeutic drug level monitoring: Secondary | ICD-10-CM | POA: Diagnosis not present

## 2019-07-19 DIAGNOSIS — Z7901 Long term (current) use of anticoagulants: Secondary | ICD-10-CM | POA: Diagnosis not present

## 2019-07-19 LAB — POCT INR: INR: 2.9 (ref 2.0–3.0)

## 2019-07-19 NOTE — Patient Instructions (Signed)
Description   Continue taking 2 tablets daily except 1 tablet on Mondays, Wednesdays and Fridays. Recheck in 1 week. Call us with medication changes [phone].

## 2019-07-24 DIAGNOSIS — F322 Major depressive disorder, single episode, severe without psychotic features: Secondary | ICD-10-CM | POA: Diagnosis not present

## 2019-07-24 DIAGNOSIS — I482 Chronic atrial fibrillation, unspecified: Secondary | ICD-10-CM | POA: Diagnosis not present

## 2019-07-24 DIAGNOSIS — E782 Mixed hyperlipidemia: Secondary | ICD-10-CM | POA: Diagnosis not present

## 2019-07-24 DIAGNOSIS — Z23 Encounter for immunization: Secondary | ICD-10-CM | POA: Diagnosis not present

## 2019-07-24 DIAGNOSIS — H9190 Unspecified hearing loss, unspecified ear: Secondary | ICD-10-CM | POA: Diagnosis not present

## 2019-07-24 DIAGNOSIS — I739 Peripheral vascular disease, unspecified: Secondary | ICD-10-CM | POA: Diagnosis not present

## 2019-07-24 DIAGNOSIS — Z0000 Encounter for general adult medical examination without abnormal findings: Secondary | ICD-10-CM | POA: Diagnosis not present

## 2019-07-24 DIAGNOSIS — E039 Hypothyroidism, unspecified: Secondary | ICD-10-CM | POA: Diagnosis not present

## 2019-07-24 DIAGNOSIS — F419 Anxiety disorder, unspecified: Secondary | ICD-10-CM | POA: Diagnosis not present

## 2019-07-24 DIAGNOSIS — R195 Other fecal abnormalities: Secondary | ICD-10-CM | POA: Diagnosis not present

## 2019-07-24 DIAGNOSIS — I639 Cerebral infarction, unspecified: Secondary | ICD-10-CM | POA: Diagnosis not present

## 2019-07-24 DIAGNOSIS — I270 Primary pulmonary hypertension: Secondary | ICD-10-CM | POA: Diagnosis not present

## 2019-07-24 DIAGNOSIS — Z7901 Long term (current) use of anticoagulants: Secondary | ICD-10-CM | POA: Diagnosis not present

## 2019-07-24 DIAGNOSIS — M810 Age-related osteoporosis without current pathological fracture: Secondary | ICD-10-CM | POA: Diagnosis not present

## 2019-08-01 ENCOUNTER — Other Ambulatory Visit: Payer: Self-pay | Admitting: Internal Medicine

## 2019-08-01 DIAGNOSIS — M810 Age-related osteoporosis without current pathological fracture: Secondary | ICD-10-CM

## 2019-09-04 ENCOUNTER — Telehealth: Payer: Self-pay | Admitting: *Deleted

## 2019-09-04 NOTE — Telephone Encounter (Signed)
Left message for the pt to call back regarding scheduling an INR appt.

## 2019-11-21 ENCOUNTER — Telehealth: Payer: Self-pay

## 2019-11-21 NOTE — Telephone Encounter (Signed)
lmomed for overdue inr 

## 2019-11-30 ENCOUNTER — Telehealth: Payer: Self-pay

## 2019-11-30 NOTE — Telephone Encounter (Signed)
lmomed the pt 3rd and final time overdue inr

## 2019-12-28 ENCOUNTER — Emergency Department (HOSPITAL_COMMUNITY): Payer: Medicare Other

## 2019-12-28 ENCOUNTER — Emergency Department (HOSPITAL_COMMUNITY)
Admission: EM | Admit: 2019-12-28 | Discharge: 2019-12-29 | Disposition: A | Discharge status: Home / Self Care | Payer: Medicare Other | Attending: Emergency Medicine | Admitting: Emergency Medicine

## 2019-12-28 ENCOUNTER — Other Ambulatory Visit: Payer: Self-pay

## 2019-12-28 DIAGNOSIS — Z79890 Hormone replacement therapy (postmenopausal): Secondary | ICD-10-CM | POA: Insufficient documentation

## 2019-12-28 DIAGNOSIS — Z91010 Allergy to peanuts: Secondary | ICD-10-CM | POA: Diagnosis not present

## 2019-12-28 DIAGNOSIS — R55 Syncope and collapse: Secondary | ICD-10-CM | POA: Diagnosis not present

## 2019-12-28 DIAGNOSIS — I4891 Unspecified atrial fibrillation: Secondary | ICD-10-CM | POA: Insufficient documentation

## 2019-12-28 DIAGNOSIS — Z79899 Other long term (current) drug therapy: Secondary | ICD-10-CM | POA: Insufficient documentation

## 2019-12-28 DIAGNOSIS — Z7901 Long term (current) use of anticoagulants: Secondary | ICD-10-CM | POA: Diagnosis not present

## 2019-12-28 DIAGNOSIS — E039 Hypothyroidism, unspecified: Secondary | ICD-10-CM | POA: Diagnosis not present

## 2019-12-28 DIAGNOSIS — Z8673 Personal history of transient ischemic attack (TIA), and cerebral infarction without residual deficits: Secondary | ICD-10-CM | POA: Diagnosis not present

## 2019-12-28 DIAGNOSIS — I10 Essential (primary) hypertension: Secondary | ICD-10-CM | POA: Insufficient documentation

## 2019-12-28 DIAGNOSIS — H547 Unspecified visual loss: Secondary | ICD-10-CM | POA: Diagnosis not present

## 2019-12-28 DIAGNOSIS — R0902 Hypoxemia: Secondary | ICD-10-CM | POA: Diagnosis not present

## 2019-12-28 DIAGNOSIS — R4020 Unspecified coma: Secondary | ICD-10-CM | POA: Diagnosis not present

## 2019-12-28 LAB — CBC WITH DIFFERENTIAL/PLATELET
Abs Immature Granulocytes: 0.05 10*3/uL (ref 0.00–0.07)
Basophils Absolute: 0 10*3/uL (ref 0.0–0.1)
Basophils Relative: 0 %
Eosinophils Absolute: 0.3 10*3/uL (ref 0.0–0.5)
Eosinophils Relative: 2 %
HCT: 37.9 % (ref 36.0–46.0)
Hemoglobin: 12.5 g/dL (ref 12.0–15.0)
Immature Granulocytes: 1 %
Lymphocytes Relative: 12 %
Lymphs Abs: 1.2 10*3/uL (ref 0.7–4.0)
MCH: 27.6 pg (ref 26.0–34.0)
MCHC: 33 g/dL (ref 30.0–36.0)
MCV: 83.7 fL (ref 80.0–100.0)
Monocytes Absolute: 0.8 10*3/uL (ref 0.1–1.0)
Monocytes Relative: 8 %
Neutro Abs: 8.3 10*3/uL — ABNORMAL HIGH (ref 1.7–7.7)
Neutrophils Relative %: 77 %
Platelets: 246 10*3/uL (ref 150–400)
RBC: 4.53 MIL/uL (ref 3.87–5.11)
RDW: 14 % (ref 11.5–15.5)
WBC: 10.7 10*3/uL — ABNORMAL HIGH (ref 4.0–10.5)
nRBC: 0 % (ref 0.0–0.2)

## 2019-12-28 LAB — BASIC METABOLIC PANEL
Anion gap: 9 (ref 5–15)
BUN: 19 mg/dL (ref 8–23)
CO2: 25 mmol/L (ref 22–32)
Calcium: 9.5 mg/dL (ref 8.9–10.3)
Chloride: 104 mmol/L (ref 98–111)
Creatinine, Ser: 1.17 mg/dL — ABNORMAL HIGH (ref 0.44–1.00)
GFR calc Af Amer: 54 mL/min — ABNORMAL LOW (ref >60)
GFR calc non Af Amer: 47 mL/min — ABNORMAL LOW (ref >60)
Glucose, Bld: 112 mg/dL — ABNORMAL HIGH (ref 70–99)
Potassium: 4.9 mmol/L (ref 3.5–5.1)
Sodium: 138 mmol/L (ref 135–145)

## 2019-12-28 LAB — PROTIME-INR
INR: 2.3 — ABNORMAL HIGH (ref 0.8–1.2)
Prothrombin Time: 24.7 seconds — ABNORMAL HIGH (ref 11.4–15.2)

## 2019-12-28 LAB — TROPONIN I (HIGH SENSITIVITY): Troponin I (High Sensitivity): 5 ng/L (ref <18)

## 2019-12-28 MED ORDER — SODIUM CHLORIDE 0.9 % IV BOLUS: 500.0000 mL | Freq: Once | INTRAVENOUS | Status: DC

## 2019-12-28 NOTE — ED Triage Notes (Signed)
Patient states that she got too hot in the house and that is why she passed out.  Patient is also very hard of hearing.

## 2019-12-28 NOTE — ED Triage Notes (Signed)
Patient here with GC from home with c/o syncopal episode for about 2 minutes while holding her phone in her hand.  Patient reports that she felt like her "vision was going dark".  Patient has had a mitral valve replacement and is on Coumadin.

## 2019-12-28 NOTE — ED Provider Notes (Signed)
[facility] DEPT Provider Note   CSN: [id number] Arrival date & time: 12/28/19  2138     History Chief Complaint  Patient presents with  . Loss of Consciousness    Patient passed out for about 2 minutes while at home using her telephone, witnessed by her family.    Jasmine Kelley is a 74 y.o. female history of A. fib, mitral valve replacement, CVA, pulmonary hypertension, PVD, hyperlipidemia on Coumadin.  Patient presents today for evaluation after syncopal episode that occurred around 7 PM today.  Patient reports that she was in her living room and was attempting to take a picture with her smart phone of her grandson when she began feeling warm, she feels that this was caused by the ceiling fan not being on.  She felt that she was going to pass out and walked to the couch and sat down and apparently had a 2-minute syncopal episode.  She denies any pain or preceding symptoms besides feeling warm, she has felt well since waking up and denies any complaints.  She reports this happened around 3 years ago when she was outside it was very warm.  She denies recent illness, denies headache, vision changes, nausea/vomiting, diarrhea, decreased appetite, chest pain, shortness of breath, cough, abdominal pain, extremity swelling/color change or any additional concerns.  HPI     Past Medical History:  Diagnosis Date  . A-fib ([place])   . Allergic rhinitis   . Anxiety   . Bell's palsy   . Cerebellar stroke ([place])    11/11/09  . Chronic anticoagulation    coumadin, AFIB  . Dermatitis   . 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 GI bleed   . Hypothyroidism   . Left shoulder pain   . Major depression   . Osteoporosis    BMD 2013  . Pulmonary HTN ([place])   . PVD (peripheral vascular disease) ([place])   . Rectus sheath hematoma    2005  . S/P MVR (mitral valve replacement)    a. mitral stenosis s/p mechanical MVR 2001.    Patient Active Problem  List   Diagnosis Date Noted  . Unspecified hypothyroidism 12/05/2013  . Mixed hyperlipidemia 12/05/2013  . A-fib ([place])   . S/P MVR (mitral valve replacement)   . Heart valve replaced by other means 03/20/2013  . Mitral valve disorder 03/21/2008  . DYSPNEA 03/21/2008    No past surgical history on file.   OB History   No obstetric history on file.     Family History  Problem Relation Age of Onset  . Breast cancer Mother   . CVA Father   . Diabetes Father   . Heart attack Father   . Stroke Father   . Hypertension Neg Hx     Social History   Tobacco Use  . Smoking status: Never Smoker  . Smokeless tobacco: Never Used  Vaping Use  . Vaping Use: Never used  Substance Use Topics  . Alcohol use: Yes  . Drug use: No    Home Medications Prior to Admission medications   Medication Sig Start Date End Date Taking? Authorizing Provider  alendronate (FOSAMAX) 70 MG tablet Take 70 mg by mouth once a week. 01/16/15   [provider]  DULoxetine (CYMBALTA) 60 MG capsule Take 60 mg by mouth daily.  01/28/15   [provider]  ferrous fumarate (HEMOCYTE - 106 MG FE) 325 (106 FE) MG TABS tablet Take 1 tablet  by mouth daily.    [provider]  LORazepam (ATIVAN) 1 MG tablet Take 0.5 tablets (0.5 mg total) by mouth every 6 (six) hours as needed for anxiety. 05/03/14   Charlesetta Shanks, MD  simvastatin (ZOCOR) 10 MG tablet Take 10 mg by mouth daily at 6 PM.  10/17/13   [provider]  SYNTHROID 100 MCG tablet  05/14/16   [provider]  valACYclovir (VALTREX) 500 MG tablet Take 500 mg by mouth daily as needed (blisters).  10/28/13   [provider]  Vitamin D, Cholecalciferol, 1000 UNITS TABS Take 1 tablet by mouth daily.    [provider]  warfarin (COUMADIN) 2 MG tablet TAKE AS DIRECTED BY COUMADIN CLINIC 08/12/18   Jettie Booze, MD    Allergies    Bee venom, Benadryl [diphenhydramine hcl (sleep)], Buspirone,  Chlorpromazine hcl, Chlorpromazine hcl, Diphenhydramine hcl, Other, and Peanut butter flavor  Review of Systems   Review of Systems Ten systems are reviewed and are negative for acute change except as noted in the HPI  Physical Exam Updated Vital Signs BP (!) 151/84   Pulse 84   Temp (!) 97.5 F (36.4 C) (Oral)   Resp (!) 33   Ht 5\' 8"  (1.727 m)   Wt 64.4 kg   SpO2 100%   BMI 21.59 kg/m   Physical Exam Constitutional:      General: She is not in acute distress.    Appearance: Normal appearance. She is well-developed. She is not ill-appearing or diaphoretic.  HENT:     Head: Normocephalic and atraumatic.  Eyes:     General: Vision grossly intact. Gaze aligned appropriately.     Pupils: Pupils are equal, round, and reactive to light.  Neck:     Trachea: Trachea and phonation normal.  Pulmonary:     Effort: Pulmonary effort is normal. No respiratory distress.  Abdominal:     General: There is no distension.     Palpations: Abdomen is soft.     Tenderness: There is no abdominal tenderness. There is no guarding or rebound.  Musculoskeletal:        General: Normal range of motion.     Cervical back: Normal range of motion.  Skin:    General: Skin is warm and dry.  Neurological:     Mental Status: She is alert.     GCS: GCS eye subscore is 4. GCS verbal subscore is 5. GCS motor subscore is 6.     Comments: Speech is clear and goal oriented, follows commands Major Cranial nerves without deficit, no facial droop Moves extremities without ataxia, coordination intact  Psychiatric:        Behavior: Behavior normal.     ED Results / Procedures / Treatments   Labs (all labs ordered are listed, but only abnormal results are displayed) Labs Reviewed  BASIC METABOLIC PANEL - Abnormal; Notable for the following components:      Result Value   Glucose, Bld 112 (*)    Creatinine, Ser 1.17 (*)    GFR calc non Af Amer 47 (*)    GFR calc Af Amer 54 (*)    All other components  within normal limits  CBC WITH DIFFERENTIAL/PLATELET - Abnormal; Notable for the following components:   WBC 10.7 (*)    Neutro Abs 8.3 (*)    All other components within normal limits  PROTIME-INR - Abnormal; Notable for the following components:   Prothrombin Time 24.7 (*)    INR  2.3 (*)    All other components within normal limits  CBG MONITORING, ED - Abnormal; Notable for the following components:   Glucose-Capillary 106 (*)    All other components within normal limits  BRAIN NATRIURETIC PEPTIDE  TROPONIN I (HIGH SENSITIVITY)  TROPONIN I (HIGH SENSITIVITY)    EKG EKG Interpretation  Date/Time:  Thursday December 28 2019 22:13:54 EDT Ventricular Rate:  90 PR Interval:    QRS Duration: 85 QT Interval:  360 QTC Calculation: 441 R Axis:   47 Text Interpretation: Sinus rhythm No significant change since last tracing Confirmed by Ripley Fraise [phone]) on 12/28/2019 11:43:29 PM   Radiology DG Chest 2 View  Result Date: 12/28/2019 CLINICAL DATA:  Syncope EXAM: CHEST - 2 VIEW COMPARISON:  03/21/2008 FINDINGS: Post sternotomy changes and valve prosthesis. No focal opacity or pleural effusion. Stable cardiomediastinal silhouette. No pneumothorax. IMPRESSION: No active cardiopulmonary disease. Electronically Signed   By: Donavan Foil M.D.   On: 12/28/2019 23:26    Procedures Procedures (including critical care time)  Medications Ordered in ED Medications  sodium chloride 0.9 % bolus 500 mL (has no administration in time range)    ED Course  I have reviewed the triage vital signs and the nursing notes.  Pertinent labs & imaging results that were available during my care of the patient were reviewed by me and considered in my medical decision making (see chart for details).    MDM Rules/Calculators/A&P                          Additional history obtained from: 1. Nursing notes from this visit. ---------------------- I ordered, reviewed and interpreted labs which  include: CBC shows mild nonspecific leukocytosis of 10.7, no anemia. PT/INR elevated 2.3. BNP within normal limits. Initial and delta high-sensitivity troponin within normal limits, 5. CBG 106. BMP shows mild elevation of creatinine 1.17, no emergent electrolyte derangement or gap.  EKG: Sinus rhythm No significant change since last tracing Confirmed by Ripley Fraise [phone]) on 12/28/2019 11:43:29 PM  CXR:    IMPRESSION:  No active cardiopulmonary disease.  ----- Creatinine suggestive of dehydration, ordered 500 mL IV fluid patient refused and prefers oral hydration.  Patient asymptomatic, walking around the emergency department requesting discharge.  Suspect patient symptoms secondary to heat possible vagal.  No evidence of anemia or hypoglycemia.  Patient on cardiac monitor throughout ER visit without evidence of arrhythmia.  No pain or shortness of breath, doubt AAA, dissection, PE, SAH or other emergent pathologies.  Patient encouraged to drink water and get plenty of rest and see PCP for recheck within 1 week.  At this time there does not appear to be any evidence of an acute emergency medical condition and the patient appears stable for discharge with appropriate outpatient follow up. Diagnosis was discussed with patient who verbalizes understanding of care plan and is agreeable to discharge. I have discussed return precautions with patient and daughter who verbalizes understanding. Patient encouraged to follow-up with their PCP. All questions answered.  Patient seen and evaluated by Dr. Christy Gentles during this visit who agrees with work-up and discharge at this time.  Note: Portions of this report may have been transcribed using voice recognition software. Every effort was made to ensure accuracy; however, inadvertent computerized transcription errors may still be present. Final Clinical Impression(s) / ED Diagnoses Final diagnoses:  Syncope, unspecified syncope type    Rx / DC  Orders ED Discharge Orders    None  Gari Crown 12/29/19 0254    Ripley Fraise, MD 12/29/19 [phone]

## 2019-12-28 NOTE — ED Notes (Signed)
Patient has been placed on the cardiac monitor with automatic BP cuff and pulse ox.  Denies any pain or discomfort at this time.

## 2019-12-29 DIAGNOSIS — R55 Syncope and collapse: Secondary | ICD-10-CM | POA: Diagnosis not present

## 2019-12-29 LAB — CBG MONITORING, ED: Glucose-Capillary: 106 mg/dL — ABNORMAL HIGH (ref 70–99)

## 2019-12-29 LAB — TROPONIN I (HIGH SENSITIVITY): Troponin I (High Sensitivity): 5 ng/L (ref <18)

## 2019-12-29 LAB — BRAIN NATRIURETIC PEPTIDE: B Natriuretic Peptide: 36.7 pg/mL (ref 0.0–100.0)

## 2019-12-29 NOTE — Discharge Instructions (Addendum)
At this time there does not appear to be the presence of an emergent medical condition, however there is always the potential for conditions to change. Please read and follow the below instructions.  Please return to the Emergency Department immediately for any new or worsening symptoms or if your symptoms reoccur. Please be sure to follow up with your Primary Care Provider within one week regarding your visit today; please call their office to schedule an appointment even if you are feeling better for a follow-up visit.  Get help right away if: You have a very bad headache. You pass out once or more than once. You have pain in your chest, belly, or back. You have a very fast or uneven heartbeat (palpitations). It hurts to breathe. You are bleeding from your mouth or your bottom (rectum). You have black or tarry poop (stool). You have jerky movements that you cannot control (seizure). You are confused. You have trouble walking. You are very weak. You have vision problems. You have any new/concerning or worsening of symptoms These symptoms may be an emergency. Do not wait to see if the symptoms will go away. Get medical help right away. Call your local emergency services (911 in the [place] Do not drive yourself to the hospital.    Please read the additional information packets attached to your discharge summary.  Do not take your medicine if  develop an itchy rash, swelling in your mouth or lips, or difficulty breathing; call 911 and seek immediate emergency medical attention if this occurs.  You may review your lab tests and imaging results in their entirety on your MyChart account.  Please discuss all results of fully with your primary care provider and other specialist at your follow-up visit.  Note: Portions of this text may have been transcribed using voice recognition software. Every effort was made to ensure accuracy; however, inadvertent computerized transcription errors may still  be present.

## 2020-07-02 DIAGNOSIS — F331 Major depressive disorder, recurrent, moderate: Secondary | ICD-10-CM | POA: Diagnosis not present

## 2020-07-02 DIAGNOSIS — I639 Cerebral infarction, unspecified: Secondary | ICD-10-CM | POA: Diagnosis not present

## 2020-07-02 DIAGNOSIS — I739 Peripheral vascular disease, unspecified: Secondary | ICD-10-CM | POA: Diagnosis not present

## 2020-07-02 DIAGNOSIS — M542 Cervicalgia: Secondary | ICD-10-CM | POA: Diagnosis not present

## 2020-07-02 DIAGNOSIS — I270 Primary pulmonary hypertension: Secondary | ICD-10-CM | POA: Diagnosis not present

## 2020-07-02 DIAGNOSIS — E782 Mixed hyperlipidemia: Secondary | ICD-10-CM | POA: Diagnosis not present

## 2020-07-02 DIAGNOSIS — E039 Hypothyroidism, unspecified: Secondary | ICD-10-CM | POA: Diagnosis not present

## 2020-07-02 DIAGNOSIS — I482 Chronic atrial fibrillation, unspecified: Secondary | ICD-10-CM | POA: Diagnosis not present

## 2020-07-23 ENCOUNTER — Encounter: Payer: Self-pay | Admitting: General Practice

## 2020-08-06 ENCOUNTER — Telehealth: Payer: Self-pay | Admitting: *Deleted

## 2020-08-06 DIAGNOSIS — E039 Hypothyroidism, unspecified: Secondary | ICD-10-CM | POA: Diagnosis not present

## 2020-08-06 NOTE — Telephone Encounter (Signed)
Daughter Dory Larsen called & stated the pt needs an appt with Anticoagulation Clinic advised that the pt needs to re-establish with Cardiologist since she has not been seen since 06/2017 by Dr. Irish Lack and last seen by Anticoagulation Clinic in 07/2019. Explained that she will need to make an appt with Dr. Irish Lack and once appt is made and she is re-established we will be able to resume Anticoagulation Monitoring. Also, the dtr states she has been seeing Dr. Lysle Rubens & they have been monitoring her INR and sending in her Warfarin. Advised they will need to continue until re-established with [place]. She will call to get an appt with Dr. Irish Lack.

## 2020-10-02 DIAGNOSIS — H9190 Unspecified hearing loss, unspecified ear: Secondary | ICD-10-CM | POA: Diagnosis not present

## 2020-10-02 DIAGNOSIS — M810 Age-related osteoporosis without current pathological fracture: Secondary | ICD-10-CM | POA: Diagnosis not present

## 2020-10-02 DIAGNOSIS — I270 Primary pulmonary hypertension: Secondary | ICD-10-CM | POA: Diagnosis not present

## 2020-10-02 DIAGNOSIS — I739 Peripheral vascular disease, unspecified: Secondary | ICD-10-CM | POA: Diagnosis not present

## 2020-10-02 DIAGNOSIS — F419 Anxiety disorder, unspecified: Secondary | ICD-10-CM | POA: Diagnosis not present

## 2020-10-02 DIAGNOSIS — E782 Mixed hyperlipidemia: Secondary | ICD-10-CM | POA: Diagnosis not present

## 2020-10-02 DIAGNOSIS — G629 Polyneuropathy, unspecified: Secondary | ICD-10-CM | POA: Diagnosis not present

## 2020-10-02 DIAGNOSIS — I482 Chronic atrial fibrillation, unspecified: Secondary | ICD-10-CM | POA: Diagnosis not present

## 2020-10-02 DIAGNOSIS — Z0000 Encounter for general adult medical examination without abnormal findings: Secondary | ICD-10-CM | POA: Diagnosis not present

## 2020-10-02 DIAGNOSIS — F331 Major depressive disorder, recurrent, moderate: Secondary | ICD-10-CM | POA: Diagnosis not present

## 2020-10-02 DIAGNOSIS — I639 Cerebral infarction, unspecified: Secondary | ICD-10-CM | POA: Diagnosis not present

## 2020-10-02 DIAGNOSIS — E039 Hypothyroidism, unspecified: Secondary | ICD-10-CM | POA: Diagnosis not present

## 2020-11-06 ENCOUNTER — Other Ambulatory Visit: Payer: Self-pay

## 2020-11-06 ENCOUNTER — Encounter: Payer: Self-pay | Admitting: Interventional Cardiology

## 2020-11-06 ENCOUNTER — Ambulatory Visit (INDEPENDENT_AMBULATORY_CARE_PROVIDER_SITE_OTHER): Payer: Medicare Other | Admitting: Interventional Cardiology

## 2020-11-06 VITALS — BP 140/72 | HR 85 | Ht 68.0 in | Wt 143.4 lb

## 2020-11-06 DIAGNOSIS — I059 Rheumatic mitral valve disease, unspecified: Secondary | ICD-10-CM | POA: Diagnosis not present

## 2020-11-06 DIAGNOSIS — Z7901 Long term (current) use of anticoagulants: Secondary | ICD-10-CM | POA: Diagnosis not present

## 2020-11-06 DIAGNOSIS — E782 Mixed hyperlipidemia: Secondary | ICD-10-CM

## 2020-11-06 MED ORDER — AMOXICILLIN 500 MG PO TABS: ORAL_TABLET | ORAL | 3 refills | Status: DC

## 2020-11-06 NOTE — Progress Notes (Signed)
Cardiology Office Note   Date:  11/06/2020   ID:  Jasmine Kelley, Jasmine Kelley 05-27-1945, MRN [id number]  PCP:  Wenda Low, MD    No chief complaint on file.  S/p MVR  Wt Readings from Last 3 Encounters:  11/06/20 143 lb 6.4 oz (65 kg)  12/28/19 142 lb (64.4 kg)  09/25/17 148 lb (67.1 kg)       History of Present Illness: Jasmine Kelley is a 75 y.o. female   who has a history of a mechanical mitral valve replacement many years ago.    Past Medical History:  Diagnosis Date   A-fib 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 mitral stenosis s/p mechanical MVR 2001.    No past surgical history on file.   Current Outpatient Medications  Medication Sig Dispense Refill   alendronate (FOSAMAX) 70 MG tablet Take 70 mg by mouth once a week.  5   DULoxetine (CYMBALTA) 60 MG capsule Take 60 mg by mouth daily.      ferrous fumarate (HEMOCYTE - 106 MG FE) 325 (106 FE) MG TABS tablet Take 1 tablet by mouth daily.     LORazepam (ATIVAN) 1 MG tablet Take 0.5 tablets (0.5 mg total) by mouth every 6 (six) hours as needed for anxiety. 15 tablet 0   simvastatin (ZOCOR) 10 MG tablet Take 10 mg by mouth daily at 6 PM.      SYNTHROID 100 MCG tablet      valACYclovir (VALTREX) 500 MG tablet Take 500 mg by mouth daily as needed (blisters).      Vitamin D, Cholecalciferol, 1000 UNITS TABS Take 1 tablet by mouth daily.     warfarin (COUMADIN) 2 MG tablet TAKE AS DIRECTED BY COUMADIN CLINIC 45 tablet 1   No current facility-administered medications for this visit.    Allergies:    Bee venom, Benadryl [diphenhydramine hcl (sleep)], Buspirone, Chlorpromazine hcl, Chlorpromazine hcl, Diphenhydramine hcl, Other, and Peanut butter flavor    Social History:  The patient  reports that she has never smoked. She has never used smokeless tobacco. She reports current alcohol use. She reports that she does not use drugs.   Family History:  The patient's family history includes Breast cancer in her mother; CVA in her father; Diabetes in her father; Heart attack in her father; Stroke in her father.    ROS:  Please see the history of present illness.   Otherwise, review of systems are positive for nuisance bruising.   All other systems are reviewed and negative.    PHYSICAL EXAM: VS:  BP 140/72   Pulse 85   Ht 5\' 8"  (1.727 m)   Wt 143 lb 6.4 oz (65 kg)   SpO2 99%   BMI 21.80 kg/m  , BMI Body mass index is 21.8 kg/m. GEN: Well nourished, well developed, in no acute distress HEENT: normal Neck: no JVD,  carotid bruits, or masses Cardiac: RRR; crisp S1 click, no murmurs, rubs, or gallops,no edema  Respiratory:  clear to auscultation bilaterally, normal work of breathing GI: soft, nontender, nondistended, + BS MS: no deformity or atrophy Skin: warm and dry, no rash Neuro:  Strength and sensation are intact Psych: euthymic mood, full affect   EKG:   The ekg ordered today demonstrates NSR, non specific ST changes   Recent Labs: 12/28/2019: B Natriuretic Peptide 36.7; BUN 19; Creatinine, Ser 1.17; Hemoglobin 12.5; Platelets 246; Potassium 4.9; Sodium 138   Lipid Panel    Component Value Date/Time   CHOL  11/24/2009 2222    180        ATP III CLASSIFICATION:  <200     mg/dL   Desirable  200-239  mg/dL   Borderline High  >=240    mg/dL   High          TRIG 268 (H) 11/24/2009 2222   HDL 35 (L) 11/24/2009 2222   CHOLHDL 5.1 11/24/2009 2222   VLDL 54 (H) 11/24/2009 2222   LDLCALC  11/24/2009 2222    91        Total Cholesterol/HDL:CHD Risk Coronary Heart Disease  Risk Table                     Men   Women  1/2 Average Risk   3.4   3.3  Average Risk       5.0   4.4  2 X Average Risk   9.6   7.1  3 X Average Risk  23.4   11.0        Use the calculated Patient Ratio above and the CHD Risk Table to determine the patient's CHD Risk.        ATP III CLASSIFICATION (LDL):  <100     mg/dL   Optimal  100-129  mg/dL   Near or Above                    Optimal  130-159  mg/dL   Borderline  160-189  mg/dL   High  >190     mg/dL   Very High     Other studies Reviewed: Additional studies/ records that were reviewed today with results demonstrating: labs reviewed.   ASSESSMENT AND PLAN:  S/p mechanical mitral valve: Coumadin to prevent valve thrombosis.   Mixed hyperlipidemia: Continue low-dose simvastatin.  LDL 82.  H/o AFib: In normal sinus rhythm at this time.  Watch for any bleeding.   DOE: Chronic. Stable.  No sign of fluid overload. Needs routine dental care to prevent prosthetic valve endocarditis.  Will call in amoxicillin.  She does need to get a new dentist.   Current medicines are reviewed at length with the patient today.  The patient concerns regarding her medicines were addressed.  The following changes have been made:  No change  Labs/ tests ordered today include:  No orders of the defined types were placed in this encounter.   Recommend 150 minutes/week of aerobic exercise Low fat, low carb, high fiber diet recommended  Disposition:   FU in 1 year   Signed, Larae Grooms, MD  11/06/2020 [facility] [address], [place], [place]  [address] Phone: [phone]; Fax: [phone]

## 2020-11-06 NOTE — Patient Instructions (Signed)
Medication Instructions:  Your physician recommends that you continue on your current medications as directed. Please refer to the Current Medication list given to you today.  *If you need a refill on your cardiac medications before your next appointment, please call your pharmacy*   Lab Work: none If you have labs (blood work) drawn today and your tests are completely normal, you will receive your results only by: [place] (if you have MyChart) OR A paper copy in the mail If you have any lab test that is abnormal or we need to change your treatment, we will call you to review the results.   Testing/Procedures: none   Follow-Up: At [facility], you and your health needs are our priority.  As part of our continuing mission to provide you with exceptional heart care, we have created designated Provider Care Teams.  These Care Teams include your primary Cardiologist (physician) and Advanced Practice Providers (APPs -  Physician Assistants and Nurse Practitioners) who all work together to provide you with the care you need, when you need it.  We recommend signing up for the patient portal called "MyChart".  Sign up information is provided on this After Visit Summary.  MyChart is used to connect with patients for Virtual Visits (Telemedicine).  Patients are able to view lab/test results, encounter notes, upcoming appointments, etc.  Non-urgent messages can be sent to your provider as well.   To learn more about what you can do with MyChart, go to [email or web address].    Your next appointment:   12 month(s)  The format for your next appointment:   In Person  Provider:   You may see Larae Grooms, MD or one of the following Advanced Practice Providers on your designated Care Team:   Melina Copa, PA-C Ermalinda Barrios, PA-C   Other Instructions Your physician discussed the importance of taking an antibiotic prior to any dental, gastrointestinal, genitourinary procedures to  prevent damage to the heart valves from infection. You were given a prescription for an antibiotic based on current SBE prophylaxis guidelines.  You have been referred to the coumadin clinic in our office.

## 2020-11-12 ENCOUNTER — Other Ambulatory Visit: Payer: Self-pay

## 2020-11-12 ENCOUNTER — Encounter: Payer: Self-pay | Admitting: *Deleted

## 2020-11-12 ENCOUNTER — Ambulatory Visit (INDEPENDENT_AMBULATORY_CARE_PROVIDER_SITE_OTHER): Payer: Medicare Other | Admitting: *Deleted

## 2020-11-12 DIAGNOSIS — Z7901 Long term (current) use of anticoagulants: Secondary | ICD-10-CM

## 2020-11-12 DIAGNOSIS — I059 Rheumatic mitral valve disease, unspecified: Secondary | ICD-10-CM | POA: Diagnosis not present

## 2020-11-12 LAB — POCT INR: INR: 2.3 (ref 2.0–3.0)

## 2020-11-12 NOTE — Patient Instructions (Addendum)
Description   Today take 1.5 tablets then continue taking the dose you have been taking, which is, 2 tablets daily except 1 tablet on Mondays, Wednesdays and Fridays. Recheck in 2 weeks. Call us with medication changes [phone].    A discussion of the nature of anticoagulants has been carried out.  A benefit risk analysis has been presented to the patient, so that they understand the justification for choosing anticoagulation at this time. The need for frequent and regular monitoring, precise dosage adjustment and compliance is stressed.  Side effects of potential bleeding are discussed.  The patient should avoid any OTC items containing aspirin or ibuprofen, and should avoid great swings in general diet.  Avoid alcohol consumption.  Call if any signs of abnormal bleeding.  Stressed the importance of adhering to appointments and being on time for appts.

## 2020-11-26 ENCOUNTER — Other Ambulatory Visit: Payer: Self-pay

## 2020-11-26 ENCOUNTER — Ambulatory Visit (INDEPENDENT_AMBULATORY_CARE_PROVIDER_SITE_OTHER): Payer: Medicare Other | Admitting: *Deleted

## 2020-11-26 DIAGNOSIS — I059 Rheumatic mitral valve disease, unspecified: Secondary | ICD-10-CM | POA: Diagnosis not present

## 2020-11-26 DIAGNOSIS — Z7901 Long term (current) use of anticoagulants: Secondary | ICD-10-CM | POA: Diagnosis not present

## 2020-11-26 LAB — POCT INR: INR: 1.9 — AB (ref 2.0–3.0)

## 2020-11-26 NOTE — Patient Instructions (Addendum)
  Description   Today take 2 tablets then start taking 2 tablets daily except 1 tablet on Tuesdays,  Thursdays, and Saturdays. Recheck in 2 weeks. Call us with medication changes [phone].

## 2020-12-10 ENCOUNTER — Other Ambulatory Visit: Payer: Self-pay

## 2020-12-10 ENCOUNTER — Ambulatory Visit (INDEPENDENT_AMBULATORY_CARE_PROVIDER_SITE_OTHER): Payer: Medicare Other

## 2020-12-10 DIAGNOSIS — I059 Rheumatic mitral valve disease, unspecified: Secondary | ICD-10-CM | POA: Diagnosis not present

## 2020-12-10 DIAGNOSIS — Z7901 Long term (current) use of anticoagulants: Secondary | ICD-10-CM | POA: Diagnosis not present

## 2020-12-10 LAB — POCT INR: INR: 5.6 — AB (ref 2.0–3.0)

## 2020-12-10 NOTE — Patient Instructions (Signed)
Description   Hold today's dose and tomorrow's dose and then continue taking 2 tablets daily except 1 tablet on Tuesdays,  Thursdays, and Saturdays. Recheck in 1 weeks. Call us with medication changes [phone].

## 2020-12-26 ENCOUNTER — Telehealth: Payer: Self-pay

## 2020-12-26 NOTE — Telephone Encounter (Signed)
LMOM FOR OVERDUE INR 

## 2020-12-31 ENCOUNTER — Telehealth: Payer: Self-pay

## 2021-01-01 ENCOUNTER — Ambulatory Visit (INDEPENDENT_AMBULATORY_CARE_PROVIDER_SITE_OTHER): Payer: Medicare Other

## 2021-01-01 ENCOUNTER — Other Ambulatory Visit: Payer: Self-pay

## 2021-01-01 DIAGNOSIS — Z7901 Long term (current) use of anticoagulants: Secondary | ICD-10-CM

## 2021-01-01 DIAGNOSIS — I059 Rheumatic mitral valve disease, unspecified: Secondary | ICD-10-CM

## 2021-01-01 LAB — POCT INR: INR: 3.8 — AB (ref 2.0–3.0)

## 2021-01-01 NOTE — Patient Instructions (Signed)
Description   Hold today's dosage of Warfarin, then start taking 1 tablet daily except 2 tablets on Mondays, Wednesdays and Fridays. Recheck in 2 weeks. Call us with medication changes [phone].

## 2021-01-15 ENCOUNTER — Other Ambulatory Visit: Payer: Self-pay

## 2021-01-15 ENCOUNTER — Ambulatory Visit (INDEPENDENT_AMBULATORY_CARE_PROVIDER_SITE_OTHER): Payer: Medicare Other | Admitting: *Deleted

## 2021-01-15 DIAGNOSIS — I059 Rheumatic mitral valve disease, unspecified: Secondary | ICD-10-CM | POA: Diagnosis not present

## 2021-01-15 DIAGNOSIS — Z7901 Long term (current) use of anticoagulants: Secondary | ICD-10-CM | POA: Diagnosis not present

## 2021-01-15 LAB — POCT INR: INR: 3.1 — AB (ref 2.0–3.0)

## 2021-01-15 NOTE — Patient Instructions (Signed)
Description   Today take 1 tablet then start taking 1 tablet daily except 2 tablets on Mondays and Fridays. Recheck in 2 weeks. Call us with medication changes [phone].

## 2021-01-28 NOTE — Telephone Encounter (Signed)
Called patient, left message.

## 2021-01-30 ENCOUNTER — Encounter (INDEPENDENT_AMBULATORY_CARE_PROVIDER_SITE_OTHER): Payer: Self-pay

## 2021-01-30 ENCOUNTER — Other Ambulatory Visit: Payer: Self-pay

## 2021-01-30 ENCOUNTER — Ambulatory Visit (INDEPENDENT_AMBULATORY_CARE_PROVIDER_SITE_OTHER): Payer: Medicare Other | Admitting: *Deleted

## 2021-01-30 DIAGNOSIS — Z7901 Long term (current) use of anticoagulants: Secondary | ICD-10-CM

## 2021-01-30 DIAGNOSIS — I059 Rheumatic mitral valve disease, unspecified: Secondary | ICD-10-CM

## 2021-01-30 LAB — POCT INR: INR: 3.4 — AB (ref 2.0–3.0)

## 2021-01-30 NOTE — Patient Instructions (Signed)
Description   Today take 1/2 tablet then start taking 1 tablet daily except 2 tablets on Fridays. Recheck in 2 weeks. Call us with medication changes [phone].

## 2021-02-13 ENCOUNTER — Ambulatory Visit (INDEPENDENT_AMBULATORY_CARE_PROVIDER_SITE_OTHER): Payer: Medicare Other | Admitting: *Deleted

## 2021-02-13 ENCOUNTER — Other Ambulatory Visit: Payer: Self-pay

## 2021-02-13 DIAGNOSIS — I059 Rheumatic mitral valve disease, unspecified: Secondary | ICD-10-CM | POA: Diagnosis not present

## 2021-02-13 DIAGNOSIS — L739 Follicular disorder, unspecified: Secondary | ICD-10-CM | POA: Diagnosis not present

## 2021-02-13 DIAGNOSIS — I639 Cerebral infarction, unspecified: Secondary | ICD-10-CM | POA: Diagnosis not present

## 2021-02-13 DIAGNOSIS — E559 Vitamin D deficiency, unspecified: Secondary | ICD-10-CM | POA: Diagnosis not present

## 2021-02-13 DIAGNOSIS — R5383 Other fatigue: Secondary | ICD-10-CM | POA: Diagnosis not present

## 2021-02-13 DIAGNOSIS — I10 Essential (primary) hypertension: Secondary | ICD-10-CM | POA: Diagnosis not present

## 2021-02-13 DIAGNOSIS — E782 Mixed hyperlipidemia: Secondary | ICD-10-CM | POA: Diagnosis not present

## 2021-02-13 DIAGNOSIS — I739 Peripheral vascular disease, unspecified: Secondary | ICD-10-CM | POA: Diagnosis not present

## 2021-02-13 DIAGNOSIS — Z7901 Long term (current) use of anticoagulants: Secondary | ICD-10-CM

## 2021-02-13 DIAGNOSIS — M810 Age-related osteoporosis without current pathological fracture: Secondary | ICD-10-CM | POA: Diagnosis not present

## 2021-02-13 DIAGNOSIS — F331 Major depressive disorder, recurrent, moderate: Secondary | ICD-10-CM | POA: Diagnosis not present

## 2021-02-13 DIAGNOSIS — I270 Primary pulmonary hypertension: Secondary | ICD-10-CM | POA: Diagnosis not present

## 2021-02-13 DIAGNOSIS — E039 Hypothyroidism, unspecified: Secondary | ICD-10-CM | POA: Diagnosis not present

## 2021-02-13 DIAGNOSIS — I482 Chronic atrial fibrillation, unspecified: Secondary | ICD-10-CM | POA: Diagnosis not present

## 2021-02-13 LAB — POCT INR: INR: 2.8 (ref 2.0–3.0)

## 2021-02-13 NOTE — Patient Instructions (Signed)
Description   Continue taking 1 tablet daily except 2 tablets on Fridays. Recheck in 3 weeks. Call us with medication changes [phone].

## 2021-03-06 ENCOUNTER — Ambulatory Visit (INDEPENDENT_AMBULATORY_CARE_PROVIDER_SITE_OTHER): Payer: Medicare Other | Admitting: *Deleted

## 2021-03-06 ENCOUNTER — Other Ambulatory Visit: Payer: Self-pay

## 2021-03-06 DIAGNOSIS — Z5181 Encounter for therapeutic drug level monitoring: Secondary | ICD-10-CM

## 2021-03-06 DIAGNOSIS — Z7901 Long term (current) use of anticoagulants: Secondary | ICD-10-CM

## 2021-03-06 DIAGNOSIS — I059 Rheumatic mitral valve disease, unspecified: Secondary | ICD-10-CM | POA: Diagnosis not present

## 2021-03-06 LAB — POCT INR: INR: 4.1 — AB (ref 2.0–3.0)

## 2021-03-06 NOTE — Patient Instructions (Signed)
Description   -Hold warfarin today -Tomorrow take 1.5 tablets of warfarin -Then Continue taking 1 tablet daily except 2 tablets on Fridays. Recheck in 2 weeks. Call us with medication changes [phone].

## 2021-03-17 DIAGNOSIS — E039 Hypothyroidism, unspecified: Secondary | ICD-10-CM | POA: Diagnosis not present

## 2021-03-20 ENCOUNTER — Ambulatory Visit (INDEPENDENT_AMBULATORY_CARE_PROVIDER_SITE_OTHER): Payer: Medicare Other

## 2021-03-20 ENCOUNTER — Other Ambulatory Visit: Payer: Self-pay

## 2021-03-20 DIAGNOSIS — I10 Essential (primary) hypertension: Secondary | ICD-10-CM | POA: Diagnosis not present

## 2021-03-20 DIAGNOSIS — E039 Hypothyroidism, unspecified: Secondary | ICD-10-CM | POA: Diagnosis not present

## 2021-03-20 DIAGNOSIS — I059 Rheumatic mitral valve disease, unspecified: Secondary | ICD-10-CM

## 2021-03-20 DIAGNOSIS — Z7901 Long term (current) use of anticoagulants: Secondary | ICD-10-CM

## 2021-03-20 LAB — POCT INR: INR: 2.5 (ref 2.0–3.0)

## 2021-03-20 NOTE — Patient Instructions (Signed)
Description   Continue taking 1 tablet daily except 2 tablets on Fridays. Recheck in 3 weeks. Call us with medication changes [phone].

## 2021-04-14 ENCOUNTER — Ambulatory Visit (INDEPENDENT_AMBULATORY_CARE_PROVIDER_SITE_OTHER): Payer: Medicare Other | Admitting: *Deleted

## 2021-04-14 ENCOUNTER — Other Ambulatory Visit: Payer: Self-pay

## 2021-04-14 DIAGNOSIS — Z7901 Long term (current) use of anticoagulants: Secondary | ICD-10-CM | POA: Diagnosis not present

## 2021-04-14 DIAGNOSIS — I059 Rheumatic mitral valve disease, unspecified: Secondary | ICD-10-CM | POA: Diagnosis not present

## 2021-04-14 LAB — POCT INR: INR: 4.2 — AB (ref 2.0–3.0)

## 2021-04-14 NOTE — Patient Instructions (Signed)
Description   Since you missed yesterday's dose, do not not any Warfarin today then continue taking 1 tablet daily except 2 tablets on Fridays. Recheck in 3 weeks. Call us with medication changes [phone].

## 2021-05-12 ENCOUNTER — Other Ambulatory Visit: Payer: Self-pay

## 2021-05-12 ENCOUNTER — Ambulatory Visit (INDEPENDENT_AMBULATORY_CARE_PROVIDER_SITE_OTHER): Payer: Medicare Other | Admitting: *Deleted

## 2021-05-12 DIAGNOSIS — Z7901 Long term (current) use of anticoagulants: Secondary | ICD-10-CM | POA: Diagnosis not present

## 2021-05-12 DIAGNOSIS — I059 Rheumatic mitral valve disease, unspecified: Secondary | ICD-10-CM | POA: Diagnosis not present

## 2021-05-12 LAB — POCT INR: INR: 3.9 — AB (ref 2.0–3.0)

## 2021-05-12 NOTE — Patient Instructions (Signed)
Description   Do not take any Warfarin today then start taking 1 tablet daily. Recheck in 2 weeks. Call us with medication changes [phone].

## 2021-05-26 ENCOUNTER — Other Ambulatory Visit: Payer: Self-pay | Admitting: Internal Medicine

## 2021-05-26 DIAGNOSIS — N390 Urinary tract infection, site not specified: Secondary | ICD-10-CM | POA: Diagnosis not present

## 2021-05-26 DIAGNOSIS — M79606 Pain in leg, unspecified: Secondary | ICD-10-CM | POA: Diagnosis not present

## 2021-05-26 DIAGNOSIS — R0989 Other specified symptoms and signs involving the circulatory and respiratory systems: Secondary | ICD-10-CM

## 2021-05-29 ENCOUNTER — Other Ambulatory Visit: Payer: Self-pay

## 2021-05-29 ENCOUNTER — Ambulatory Visit (INDEPENDENT_AMBULATORY_CARE_PROVIDER_SITE_OTHER): Payer: Medicare Other | Admitting: *Deleted

## 2021-05-29 DIAGNOSIS — Z7901 Long term (current) use of anticoagulants: Secondary | ICD-10-CM | POA: Diagnosis not present

## 2021-05-29 DIAGNOSIS — I059 Rheumatic mitral valve disease, unspecified: Secondary | ICD-10-CM | POA: Diagnosis not present

## 2021-05-29 LAB — POCT INR: INR: 3.7 — AB (ref 2.0–3.0)

## 2021-05-29 NOTE — Patient Instructions (Signed)
Description   Do not take any Warfarin today then start taking 1 tablet daily except 1/2 tablet on Sundays. Recheck in 2 weeks. Call us with medication changes [phone].

## 2021-06-04 DIAGNOSIS — R0989 Other specified symptoms and signs involving the circulatory and respiratory systems: Secondary | ICD-10-CM | POA: Diagnosis not present

## 2021-06-19 ENCOUNTER — Ambulatory Visit (INDEPENDENT_AMBULATORY_CARE_PROVIDER_SITE_OTHER): Payer: Medicare Other

## 2021-06-19 ENCOUNTER — Other Ambulatory Visit: Payer: Self-pay

## 2021-06-19 DIAGNOSIS — I059 Rheumatic mitral valve disease, unspecified: Secondary | ICD-10-CM

## 2021-06-19 DIAGNOSIS — Z5181 Encounter for therapeutic drug level monitoring: Secondary | ICD-10-CM

## 2021-06-19 DIAGNOSIS — Z7901 Long term (current) use of anticoagulants: Secondary | ICD-10-CM

## 2021-06-19 LAB — POCT INR: INR: 1.4 — AB (ref 2.0–3.0)

## 2021-06-19 NOTE — Patient Instructions (Signed)
Description   Take 2 tablets today and 1.5 tablets tomorrow and then continue taking 1 tablet daily except 1/2 tablet on Sundays. Recheck in 2 weeks. Call us with medication changes [phone].

## 2021-07-07 ENCOUNTER — Other Ambulatory Visit: Payer: Self-pay

## 2021-07-07 ENCOUNTER — Ambulatory Visit (INDEPENDENT_AMBULATORY_CARE_PROVIDER_SITE_OTHER): Payer: Medicare Other | Admitting: *Deleted

## 2021-07-07 DIAGNOSIS — I059 Rheumatic mitral valve disease, unspecified: Secondary | ICD-10-CM

## 2021-07-07 DIAGNOSIS — Z7901 Long term (current) use of anticoagulants: Secondary | ICD-10-CM

## 2021-07-07 LAB — POCT INR: INR: 1.6 — AB (ref 2.0–3.0)

## 2021-07-07 NOTE — Patient Instructions (Signed)
Description   ?Today take 1.5 tablets then start taking 1 tablet daily. Recheck in 2 weeks. Call us with medication changes [phone]. ?  ?  ?

## 2021-07-21 ENCOUNTER — Ambulatory Visit (INDEPENDENT_AMBULATORY_CARE_PROVIDER_SITE_OTHER): Payer: Medicare Other

## 2021-07-21 ENCOUNTER — Other Ambulatory Visit: Payer: Self-pay

## 2021-07-21 DIAGNOSIS — I059 Rheumatic mitral valve disease, unspecified: Secondary | ICD-10-CM | POA: Diagnosis not present

## 2021-07-21 DIAGNOSIS — Z7901 Long term (current) use of anticoagulants: Secondary | ICD-10-CM | POA: Diagnosis not present

## 2021-07-21 LAB — POCT INR: INR: 1.5 — AB (ref 2.0–3.0)

## 2021-07-21 NOTE — Patient Instructions (Signed)
Description   ?Start taking 1 tablet daily except 1.5 tablets on Mondays, Wednesdays and Fridays. Recheck in 10 days. Call us with medication changes [phone]. ?  ?  ?

## 2021-08-06 ENCOUNTER — Ambulatory Visit (INDEPENDENT_AMBULATORY_CARE_PROVIDER_SITE_OTHER): Payer: Medicare Other | Admitting: *Deleted

## 2021-08-06 DIAGNOSIS — Z7901 Long term (current) use of anticoagulants: Secondary | ICD-10-CM | POA: Diagnosis not present

## 2021-08-06 DIAGNOSIS — I059 Rheumatic mitral valve disease, unspecified: Secondary | ICD-10-CM

## 2021-08-06 LAB — POCT INR: INR: 3.8 — AB (ref 2.0–3.0)

## 2021-08-06 NOTE — Patient Instructions (Signed)
Description   ?Do not take any [place]arn today then start taking 1 tablet daily except 1.5 tablets on Mondays and Fridays. Recheck in 2 weeks. Call us with medication changes [phone]. ?  ?  ?

## 2021-08-07 DIAGNOSIS — E039 Hypothyroidism, unspecified: Secondary | ICD-10-CM | POA: Diagnosis not present

## 2021-08-07 DIAGNOSIS — M810 Age-related osteoporosis without current pathological fracture: Secondary | ICD-10-CM | POA: Diagnosis not present

## 2021-08-07 DIAGNOSIS — I270 Primary pulmonary hypertension: Secondary | ICD-10-CM | POA: Diagnosis not present

## 2021-08-07 DIAGNOSIS — E782 Mixed hyperlipidemia: Secondary | ICD-10-CM | POA: Diagnosis not present

## 2021-08-07 DIAGNOSIS — G629 Polyneuropathy, unspecified: Secondary | ICD-10-CM | POA: Diagnosis not present

## 2021-08-07 DIAGNOSIS — Z8673 Personal history of transient ischemic attack (TIA), and cerebral infarction without residual deficits: Secondary | ICD-10-CM | POA: Diagnosis not present

## 2021-08-07 DIAGNOSIS — H9190 Unspecified hearing loss, unspecified ear: Secondary | ICD-10-CM | POA: Diagnosis not present

## 2021-08-07 DIAGNOSIS — D6869 Other thrombophilia: Secondary | ICD-10-CM | POA: Diagnosis not present

## 2021-08-07 DIAGNOSIS — I482 Chronic atrial fibrillation, unspecified: Secondary | ICD-10-CM | POA: Diagnosis not present

## 2021-08-18 ENCOUNTER — Encounter: Payer: Self-pay | Admitting: Podiatrist

## 2021-08-18 ENCOUNTER — Ambulatory Visit (INDEPENDENT_AMBULATORY_CARE_PROVIDER_SITE_OTHER): Payer: Medicare Other | Admitting: Podiatrist

## 2021-08-18 VITALS — BP 152/74 | HR 69

## 2021-08-18 DIAGNOSIS — I7300 Raynaud's syndrome without gangrene: Secondary | ICD-10-CM

## 2021-08-18 NOTE — Patient Instructions (Signed)
Raynaud's Phenomenon  Raynaud's phenomenon is a condition that affects the blood vessels (arteries) that carry blood to the fingers and toes. The arteries that supply blood to the ears, lips, nipples, or the tip of the nose might also be affected. Raynaud's phenomenon causes the arteries to become narrow temporarily (spasm). As a result, the flow of blood to the affected areas is temporarily decreased. This usually occurs in response to cold temperatures or stress. During an attack, the skin in the affected areas turns white, then blue, and finally red. A person may also feel tingling or numbness in those areas. Attacks usually last for only a brief period, and then the blood flow to the area returns to normal. In most cases, Raynaud's phenomenon does not cause serious health problems. What are the causes? In many cases, the cause of this condition is not known. The condition may occur on its own (primary Raynaud's phenomenon) or may be associated with other diseases or factors (secondary Raynaud's phenomenon). Possible causes may include: Diseases or medical conditions that damage the arteries. Injuries and repetitive actions that hurt the hands or feet. Being exposed to certain chemicals. Taking medicines that narrow the arteries. Other medical conditions, such as lupus, scleroderma, rheumatoid arthritis, thyroid problems, blood disorders, Sjogren syndrome, or atherosclerosis. What increases the risk? The following factors may make you more likely to develop this condition: Being 20-40 years old. Being female. Having a family history of Raynaud's phenomenon. Living in a cold climate. Smoking. What are the signs or symptoms? Symptoms of this condition usually occur when you are exposed to cold temperatures or when you have emotional stress. The symptoms may last for a few minutes or up to several hours. They usually affect your fingers but may also affect your toes, nipples, lips, ears, or the  tip of your nose. Symptoms may include: Changes in skin color. The skin in the affected areas will turn pale or white. The skin may then change from white to bluish to red as normal blood flow returns to the area. Numbness, tingling, or pain in the affected areas. In severe cases, symptoms may include: Skin sores. Tissues decaying and dying (gangrene). How is this diagnosed? This condition may be diagnosed based on: Your symptoms and medical history. A physical exam. During the exam, you may be asked to put your hands in cold water to check for a reaction to cold temperature. Tests, such as: Blood tests to check for other diseases or conditions. A test to check the movement of blood through your arteries and veins (vascular ultrasound). A test in which the skin at the base of your fingernail is examined under a microscope (nailfold capillaroscopy). How is this treated? During an episode, you can take actions to help symptoms go away faster. Options include moving your arms around in a windmill pattern, warming your fingers under warm water, or placing your fingers in a warm body fold, such as your armpit. Long-term treatment for this condition often involves making lifestyle changes and taking steps to control your exposure to cold temperature. For more severe cases, medicine (calcium channel blockers) may be used to improve blood circulation. Follow these instructions at home: Avoiding cold temperatures Take these steps to avoid exposure to cold: If possible, stay indoors during cold weather. When you go outside during cold weather, dress in layers and wear mittens, a hat, a scarf, and warm footwear. Wear mittens or gloves when handling ice or frozen food. Use holders for glasses or cans containing   cold drinks. Let warm water run for a while before taking a shower or bath. Warm up the car before driving in cold weather. Lifestyle If possible, avoid stressful and emotional situations. Try  to find ways to manage your stress, such as: Exercise. Yoga. Meditation. Biofeedback. Do not use any products that contain nicotine or tobacco. These products include cigarettes, chewing tobacco, and vaping devices, such as e-cigarettes. If you need help quitting, ask your health care provider. Avoid secondhand smoke. Limit your use of caffeine. Switch to decaffeinated coffee, tea, and soda. Avoid chocolate. Avoid vibrating tools and machinery. General instructions Protect your hands and feet from injuries, cuts, or bruises. Avoid wearing tight rings or wristbands. Wear loose fitting socks and comfortable, roomy shoes. Take over-the-counter and prescription medicines only as told by your health care provider. Where to find support Raynaud's Association: www.raynauds.org Where to find more information National Institute of Arthritis and Musculoskeletal and Skin Diseases: www.niams.nih.gov Contact a health care provider if: Your discomfort becomes worse despite lifestyle changes. You develop sores on your fingers or toes that do not heal. You have breaks in the skin on your fingers or toes. You have a fever. You have pain or swelling in your joints. You have a rash. Your symptoms occur on only one side of your body. Get help right away if: Your fingers or toes turn black. You have severe pain in the affected areas. These symptoms may represent a serious problem that is an emergency. Do not wait to see if the symptoms will go away. Get medical help right away. Call your local emergency services (911 in the [place] Do not drive yourself to the hospital. Summary Raynaud's phenomenon is a condition that affects the arteries that carry blood to the fingers, toes, ears, lips, nipples, or the tip of the nose. In many cases, the cause of this condition is not known. Symptoms of this condition include changes in skin color along with numbness and tingling in the affected area. Treatment for  this condition includes lifestyle changes and reducing exposure to cold temperatures. Medicines may be used for severe cases of the condition. Contact your health care provider if your condition worsens despite treatment. This information is not intended to replace advice given to you by your health care provider. Make sure you discuss any questions you have with your health care provider. Document Revised: 06/25/2020 Document Reviewed: 06/25/2020 Elsevier Patient Education  2023 Elsevier Inc.  

## 2021-08-18 NOTE — Progress Notes (Signed)
?Chief Complaint  ?Patient presents with  ? Foot Pain  ?  Patient states that she has poor circulation and that her left foots swells and turns purple   ?  ? ?HPI: Patient is 76 y.o. female who presents today with her daughter, Dory Larsen for painful, and purple feet.  She notices the feet turn purple up to the middle part of her feet especially when standing.  At night her feet are painful as well.  She has had a circulation study and relates it was normal.  She also has pain in the right second and third toes and the feet swell at times.   ? ?Patient Active Problem List  ? Diagnosis Date Noted  ? Unspecified hypothyroidism 12/05/2013  ? Mixed hyperlipidemia 12/05/2013  ? A-fib ([place])   ? S/P MVR (mitral valve replacement)   ? Heart valve replaced by other means 03/20/2013  ? Long term current use of anticoagulant therapy 03/20/2013  ? Mitral valve disorder 03/21/2008  ? DYSPNEA 03/21/2008  ? ? ?Current Outpatient Medications on File Prior to Visit  ?Medication Sig Dispense Refill  ? azelastine (ASTELIN) 0.1 % nasal spray 1 puff in each nostril    ? LORazepam (ATIVAN) 0.5 MG tablet 1/2-1  tablet as needed    ? valsartan (DIOVAN) 80 MG tablet 1 tablet    ? Vitamin D, Ergocalciferol, 50000 units CAPS 1 capsule    ? alendronate (FOSAMAX) 70 MG tablet Take 70 mg by mouth once a week.  5  ? DULoxetine (CYMBALTA) 60 MG capsule Take 60 mg by mouth daily.     ? DULoxetine (CYMBALTA) 60 MG capsule Take 1 tablet by mouth daily.    ? ferrous fumarate (HEMOCYTE - 106 MG FE) 325 (106 FE) MG TABS tablet Take 1 tablet by mouth daily.    ? gabapentin (NEURONTIN) 100 MG capsule Take 100-200 mg by mouth at bedtime.    ? levothyroxine (SYNTHROID) 137 MCG tablet TAKE 1 TABLET BY MOUTH IN THE MORNING ON AN EMPTY STOMACH ONCE A DAY    ? LORazepam (ATIVAN) 1 MG tablet Take 0.5 tablets (0.5 mg total) by mouth every 6 (six) hours as needed for anxiety. 15 tablet 0  ? simvastatin (ZOCOR) 10 MG tablet Take 10 mg by mouth daily at 6 PM.     ?  simvastatin (ZOCOR) 10 MG tablet every evening.    ? SYNTHROID 100 MCG tablet     ? valACYclovir (VALTREX) 500 MG tablet Take 500 mg by mouth daily as needed (blisters).     ? valACYclovir (VALTREX) 500 MG tablet Take 1 tablet by mouth 2 (two) times daily as needed.    ? Vitamin D, Cholecalciferol, 1000 UNITS TABS Take 1 tablet by mouth daily.    ? warfarin (COUMADIN) 2 MG tablet TAKE AS DIRECTED BY COUMADIN CLINIC 45 tablet 1  ? ?No current facility-administered medications on file prior to visit.  ? ? ?Allergies  ?Allergen Reactions  ? Bee Venom Shortness Of Breath and Swelling  ? Benadryl [Diphenhydramine Hcl (Sleep)]   ?  jittery  ? Buspirone Nausea Only  ?  MAKES HER SICK ?MAKES HER SICK  ? Chlorpromazine Hcl   ? Chlorpromazine Hcl Other (See Comments)  ? Diphenhydramine Hcl Other (See Comments)  ? Mirtazapine   ?  Other reaction(s): intol  ? Other Other (See Comments)  ?  jittery  ? Peanut Butter Flavor Nausea Only  ?  dizziness  ? ? ?Review of Systems ?No fevers, chills,  nausea, muscle aches, no difficulty breathing, no calf pain, no chest pain or shortness of breath. ? ? ?Physical Exam ? ?GENERAL APPEARANCE: Alert, conversant. Appropriately groomed. No acute distress.  ? ?VASCULAR: Pedal pulses palpable 1/4 DP and PT bilateral.  Capillary refill time is delayed to the digits,  proximal to distal cooling is cool to cool.  Skin color is purple especially when standing, right grater than left.  Left hallux has an area of blanching on the dorsal aspect of the toe. ?Hair growth is absent bilateral ? ?NEUROLOGIC: sensation is intact to 5.07 monofilament at 3/5 sites bilateral.  Light touch is intact bilateral, vibratory sensation decreased bilateral ? ?MUSCULOSKELETAL: acceptable muscle strength, tone and stability bilateral. Contracture of digits 2,3 of the right foot noted.   ? ?DERMATOLOGIC: skin is warm, supple, and dry.  Color, texture, and turgor of skin within normal limits.  No open wounds are noted.  No  preulcerative lesions are seen.  Digital nails are asymptomatic.  Small heart shaped tattoo present right hallux dorsal.  ? ? ? ?Assessment  ? ?  ICD-10-CM   ?1. Raynaud's phenomenon without gangrene  I73.00   ?  ? ? ? ?Plan ? ?Discussed exam findings with Dory Larsen and Stanton Kidney and discussed that this appears to be from Raynauds phenomenon.  Discussed Raynauds and dispensed information as and also recommended getting a circulation study at [facility].  I will order the study and will call with the results of the test as well as treatment recommendations.   ?

## 2021-08-28 ENCOUNTER — Ambulatory Visit (INDEPENDENT_AMBULATORY_CARE_PROVIDER_SITE_OTHER): Payer: Medicare Other | Admitting: *Deleted

## 2021-08-28 DIAGNOSIS — I059 Rheumatic mitral valve disease, unspecified: Secondary | ICD-10-CM | POA: Diagnosis not present

## 2021-08-28 DIAGNOSIS — Z7901 Long term (current) use of anticoagulants: Secondary | ICD-10-CM | POA: Diagnosis not present

## 2021-08-28 LAB — POCT INR: INR: 3.9 — AB (ref 2.0–3.0)

## 2021-08-28 NOTE — Patient Instructions (Signed)
Description   ?Do not take any Warfarin today then start taking 1 tablet daily except 1.5 tablets on Mondays. Recheck in 2 weeks. Call us with medication changes [phone]. ?  ?  ?

## 2021-09-04 ENCOUNTER — Other Ambulatory Visit: Payer: Self-pay | Admitting: Podiatrist

## 2021-09-04 DIAGNOSIS — I7300 Raynaud's syndrome without gangrene: Secondary | ICD-10-CM

## 2021-09-08 ENCOUNTER — Ambulatory Visit (INDEPENDENT_AMBULATORY_CARE_PROVIDER_SITE_OTHER): Payer: Medicare Other

## 2021-09-08 ENCOUNTER — Ambulatory Visit (HOSPITAL_COMMUNITY)
Admission: RE | Admit: 2021-09-08 | Discharge: 2021-09-08 | Disposition: A | Discharge status: Home / Self Care | Payer: Medicare Other | Source: Ambulatory Visit | Attending: Internal Medicine | Admitting: Internal Medicine

## 2021-09-08 DIAGNOSIS — I7300 Raynaud's syndrome without gangrene: Secondary | ICD-10-CM | POA: Diagnosis not present

## 2021-09-08 DIAGNOSIS — Z7901 Long term (current) use of anticoagulants: Secondary | ICD-10-CM | POA: Diagnosis not present

## 2021-09-08 DIAGNOSIS — I059 Rheumatic mitral valve disease, unspecified: Secondary | ICD-10-CM | POA: Diagnosis not present

## 2021-09-08 DIAGNOSIS — Z5181 Encounter for therapeutic drug level monitoring: Secondary | ICD-10-CM | POA: Diagnosis not present

## 2021-09-08 LAB — POCT INR: INR: 2.5 (ref 2.0–3.0)

## 2021-09-08 NOTE — Patient Instructions (Signed)
Continue taking  1 tablet daily except 1.5 tablets on Mondays. Recheck in 4 weeks. Call us with medication changes [phone]. ?

## 2021-09-22 ENCOUNTER — Telehealth: Payer: Self-pay | Admitting: *Deleted

## 2021-09-22 NOTE — Telephone Encounter (Signed)
Spoke with daughter Cecille Rubin) giving results, and recommendations per Dr Valentina Lucks, verbalized understanding and is in agreement for recommendations.  Please contact patient to schedule 4 weeks f/u with Dr Jacqualyn Posey.

## 2021-09-22 NOTE — Telephone Encounter (Signed)
-----   Message from Bronson Ing, DPM sent at 09/15/2021  1:11 PM EDT ----- Regarding: vascular result Hi Jasmine Kelley!  Would you mind calling Jasmine Kelley and letting her know the vascular study they did to test her overall circulation came back normal... However, it appears they did not specifically test for Raynauds.   I would recommend trying her on some Nitroglycerin patches to help increase blood flow to her feet.  Could you ask if she would like to try this?    I would also recommend she see Dr. Jacqualyn Posey in 4 or so weeks for follow up and if she needs to be placed on an oral therapy, he can discuss with her at that time.  Thanks so much!!!

## 2021-10-17 DIAGNOSIS — I10 Essential (primary) hypertension: Secondary | ICD-10-CM | POA: Diagnosis not present

## 2021-10-17 DIAGNOSIS — E782 Mixed hyperlipidemia: Secondary | ICD-10-CM | POA: Diagnosis not present

## 2021-10-17 DIAGNOSIS — I482 Chronic atrial fibrillation, unspecified: Secondary | ICD-10-CM | POA: Diagnosis not present

## 2021-10-17 DIAGNOSIS — I739 Peripheral vascular disease, unspecified: Secondary | ICD-10-CM | POA: Diagnosis not present

## 2021-10-17 DIAGNOSIS — M810 Age-related osteoporosis without current pathological fracture: Secondary | ICD-10-CM | POA: Diagnosis not present

## 2021-10-17 DIAGNOSIS — G629 Polyneuropathy, unspecified: Secondary | ICD-10-CM | POA: Diagnosis not present

## 2021-10-17 DIAGNOSIS — E559 Vitamin D deficiency, unspecified: Secondary | ICD-10-CM | POA: Diagnosis not present

## 2021-10-17 DIAGNOSIS — I270 Primary pulmonary hypertension: Secondary | ICD-10-CM | POA: Diagnosis not present

## 2021-10-17 DIAGNOSIS — D6869 Other thrombophilia: Secondary | ICD-10-CM | POA: Diagnosis not present

## 2021-10-17 DIAGNOSIS — Z0000 Encounter for general adult medical examination without abnormal findings: Secondary | ICD-10-CM | POA: Diagnosis not present

## 2021-10-17 DIAGNOSIS — E039 Hypothyroidism, unspecified: Secondary | ICD-10-CM | POA: Diagnosis not present

## 2021-10-22 ENCOUNTER — Ambulatory Visit (INDEPENDENT_AMBULATORY_CARE_PROVIDER_SITE_OTHER): Payer: Medicare Other

## 2021-10-22 DIAGNOSIS — Z7901 Long term (current) use of anticoagulants: Secondary | ICD-10-CM

## 2021-10-22 DIAGNOSIS — I059 Rheumatic mitral valve disease, unspecified: Secondary | ICD-10-CM | POA: Diagnosis not present

## 2021-10-22 LAB — POCT INR: INR: 3.1 — AB (ref 2.0–3.0)

## 2021-10-22 NOTE — Patient Instructions (Addendum)
Description   Eat a serving of greens today and continue taking  1 tablet daily except 1.5 tablets on Wednesdays.  Recheck in 4 weeks.  Call us with medication changes [phone].

## 2021-10-23 ENCOUNTER — Other Ambulatory Visit: Payer: Self-pay | Admitting: Internal Medicine

## 2021-10-23 DIAGNOSIS — M8588 Other specified disorders of bone density and structure, other site: Secondary | ICD-10-CM

## 2021-10-28 ENCOUNTER — Ambulatory Visit (INDEPENDENT_AMBULATORY_CARE_PROVIDER_SITE_OTHER): Payer: Medicare Other | Admitting: Podiatry

## 2021-10-28 DIAGNOSIS — I7300 Raynaud's syndrome without gangrene: Secondary | ICD-10-CM | POA: Diagnosis not present

## 2021-10-30 DIAGNOSIS — Z1212 Encounter for screening for malignant neoplasm of rectum: Secondary | ICD-10-CM | POA: Diagnosis not present

## 2021-10-30 DIAGNOSIS — Z1211 Encounter for screening for malignant neoplasm of colon: Secondary | ICD-10-CM | POA: Diagnosis not present

## 2021-11-10 DIAGNOSIS — I7300 Raynaud's syndrome without gangrene: Secondary | ICD-10-CM | POA: Insufficient documentation

## 2021-11-10 NOTE — Progress Notes (Signed)
Subjective: 76 year old female presents the office today for follow-up evaluation of Raynaud's. She was last seen by Dr. Valentina Lucks.  Arterial studies were performed.  Denies any ulcerations.  No significant changes from her last appointment.  Objective: AAO x3, NAD-presents with daughter DP/PT pulses decreased bilaterally, CRT delayed bilaterally with purple discoloration noted to the forefoot.  There is no skin breakdown, ulcerations or gangrenous changes. No pain with calf compression, swelling, warmth, erythema  Assessment: Raynaud's  Plan: -All treatment options discussed with the patient including all alternatives, risks, complications.  -ABIs reviewed.  Normal ABIs and normal TBI's.  Discussed still likely Raynaud's different treatment options for this.  They would like to hold off on oral medication.  I ordered a compound cream to [facility] to help with circulation. -Patient encouraged to call the office with any questions, concerns, change in symptoms.   Trula Slade DPM

## 2021-11-13 ENCOUNTER — Telehealth: Payer: Self-pay | Admitting: Podiatry

## 2021-11-13 NOTE — Telephone Encounter (Signed)
Medication from the appointment 10/28/21 she said there was suppose to be patches and a cream sent to [facility] but they have never received any medications? Please advise

## 2021-11-14 NOTE — Telephone Encounter (Signed)
Called patient and let her daughter know that the cream was called in , Dr Jacqualyn Posey wants to start with the cream first and see how it does.

## 2021-12-04 ENCOUNTER — Telehealth: Payer: Self-pay | Admitting: *Deleted

## 2021-12-04 DIAGNOSIS — R195 Other fecal abnormalities: Secondary | ICD-10-CM | POA: Diagnosis not present

## 2021-12-04 NOTE — Telephone Encounter (Signed)
   Pre-operative Risk Assessment    Patient Name: Jasmine Kelley  DOB: 05/24/1945 MRN: [id number]      Request for Surgical Clearance    Procedure:   COLONOSCOPY  Date of Surgery:  Clearance 12/18/21                                 Surgeon:  DR. Paulita Fujita Surgeon's Group or Practice Name:  [facility] Phone number:  [phone] Fax number:  [phone]   Type of Clearance Requested:   - Pharmacy:  Hold Warfarin (Coumadin) THEY WANT CARDIOLOGIST TELL THEM HOW LONG TO HOLD   Type of Anesthesia:   PROPOFOL   Additional requests/questions:    [name]   12/04/2021, 10:47 AM

## 2021-12-09 NOTE — Telephone Encounter (Signed)
Spoke with pt's daughter (DPR on file) who is agreeable to see Estella Husk on 8/9 at 12:15 p. She thanked me for the call.  Will fax over to requesting provider.

## 2021-12-09 NOTE — Telephone Encounter (Signed)
[name], MD  Chart reviewed as part of pre-operative protocol coverage. Because of Jasmine Kelley's past medical history and time since last visit, he/she will require a follow-up visit in order to better assess preoperative cardiovascular risk.  Pre-op covering staff: - Please schedule appointment and call patient to inform them. - Please contact requesting surgeon's office via preferred method (i.e, phone, fax) to inform them of need for appointment prior to surgery.  Request has been reviewed by our pharmacist. Per office protocol, patient can hold warfarin for 5 days prior to procedure.     Patient WILL need bridging with Lovenox (enoxaparin) around procedure.   This will be coordinated by our coumadin clinic.  [name], NP-C    12/09/2021, 8:21 AM [place] [address], [address] Office [phone] Fax [phone]

## 2021-12-09 NOTE — Telephone Encounter (Signed)
I tried reaching patient on home phone and left message.  I also tried daughter, but mailbox full.  She needs INR appointment this week for Lovenox Bridging prior to 8/17 Colonoscopy.

## 2021-12-09 NOTE — Telephone Encounter (Signed)
Patient with diagnosis of mechanical mitral valve on warfarin for anticoagulation.    Procedure: colonoscopy Date of procedure: 12/18/21  CrCl 71 ml/min Platelet count 234  Per office protocol, patient can hold warfarin for 5 days prior to procedure.    Patient WILL need bridging with Lovenox (enoxaparin) around procedure.  This will be coordinated by our coumadin clinic.  **This guidance is not considered finalized until pre-operative APP has relayed final recommendations.**

## 2021-12-09 NOTE — Progress Notes (Unsigned)
Cardiology Office Note    Date:  12/10/2021   ID:  Jasmine, Kelley [birth year], MRN [id number]   PCP:  Wenda Low, MD   [place]  Cardiologist:  Larae Grooms, MD   Advanced Practice Provider:  No care team member to display Electrophysiologist:  None   [phone]   No chief complaint on file.   History of Present Illness:  Jasmine Kelley is a 76 y.o. 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 Echo 06/2016 LVEF 55-60% normal valve.  Patient last saw Dr. Irish Lack 11/06/20 and doing well.  Patient on my schedule for preop clearance for colonoscopy by Dr. Paulita Fujita. She'll need a lovenox bridge.  Patient has chronic DOE going uphill unchanged. She says her heart races when going uphill or vacuuming. Walks about 15 min daily. Denies chest pain, dyspnea at rest, palpitations, edema. Recently diagnosed with Raynaud's and trying to get a cream.     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 mitral stenosis s/p mechanical MVR 2001.    History reviewed. No pertinent surgical history.  Current Medications: Current Meds  Medication Sig   alendronate (FOSAMAX) 70 MG tablet Take 70 mg by mouth once a week.   azelastine (ASTELIN) 0.1 % nasal spray 1 puff in each nostril   DULoxetine (CYMBALTA) 60 MG capsule Take 1 tablet by mouth daily.   ferrous fumarate (HEMOCYTE - 106 MG FE) 325 (106 FE) MG TABS tablet Take 1 tablet by mouth  daily.   gabapentin (NEURONTIN) 100 MG capsule Take 100-200 mg by mouth at bedtime.   LORazepam (ATIVAN) 0.5 MG tablet 1/2-1  tablet as needed   LORazepam (ATIVAN) 1 MG tablet Take 0.5 tablets (0.5 mg total) by mouth every 6 (six) hours as needed for anxiety.   simvastatin (ZOCOR) 10 MG tablet every evening.   SYNTHROID 100 MCG tablet    valACYclovir (VALTREX) 500 MG tablet Take 500 mg by mouth daily as needed (blisters).    valACYclovir (VALTREX) 500 MG tablet Take 1 tablet by mouth 2 (two) times daily as needed.   valsartan (DIOVAN) 80 MG tablet 1 tablet   Vitamin D, Cholecalciferol, 1000 UNITS TABS Take 1 tablet by mouth daily.   Vitamin D, Ergocalciferol, 50000 units CAPS 1 capsule   warfarin (COUMADIN) 2 MG tablet TAKE AS DIRECTED BY COUMADIN CLINIC   [DISCONTINUED] DULoxetine (CYMBALTA) 60 MG capsule Take 60 mg by mouth daily.    [DISCONTINUED] levothyroxine (SYNTHROID) 137 MCG tablet TAKE 1 TABLET BY MOUTH IN THE MORNING ON AN EMPTY STOMACH ONCE A DAY   [DISCONTINUED] simvastatin (ZOCOR) 10 MG tablet Take 10 mg by mouth daily at 6 PM.      Allergies:   Bee venom, Benadryl [diphenhydramine hcl (sleep)], Buspirone, Chlorpromazine hcl,  Chlorpromazine hcl, Diphenhydramine hcl, Mirtazapine, Other, and Peanut butter flavor   Social History   Socioeconomic History   Marital status: Married    Spouse name: Not on file   Number of children: Not on file   Years of education: Not on file   Highest education level: Not on file  Occupational History   Not on file  Tobacco Use   Smoking status: Never   Smokeless tobacco: Never  Vaping Use   Vaping Use: Never used  Substance and Sexual Activity   Alcohol use: Yes   Drug use: No   Sexual activity: Never  Other Topics Concern   Not on file  Social History Narrative   Not on file   Social Determinants of Health   Financial Resource Strain: Not on file  Food Insecurity: Not on file  Transportation Needs: Not on file  Physical  Activity: Not on file  Stress: Not on file  Social Connections: Not on file     Family History:  The patient's  family history includes Breast cancer in her mother; CVA in her father; Diabetes in her father; Heart attack in her father; Stroke in her father.   ROS:   Please see the history of present illness.    ROS All other systems reviewed and are negative.   PHYSICAL EXAM:   VS:  BP 136/72   Pulse 90   Ht '5\' 8"'$  (1.727 m)   Wt 140 lb (63.5 kg)   SpO2 98%   BMI 21.29 kg/m   Physical Exam  GEN: Thin, in no acute distress  Neck: no JVD, carotid bruits, or masses Cardiac:RRR; crisp valve, no murmur Respiratory:  clear to auscultation bilaterally, normal work of breathing GI: soft, nontender, nondistended, + BS Ext: without cyanosis, clubbing, or edema, Good distal pulses bilaterally Neuro:  Alert and Oriented x 3,  Psych: euthymic mood, full affect  Wt Readings from Last 3 Encounters:  12/10/21 140 lb (63.5 kg)  11/06/20 143 lb 6.4 oz (65 kg)  12/28/19 142 lb (64.4 kg)      Studies/Labs Reviewed:   EKG:  EKG is  ordered today.  The ekg ordered today demonstrates NSR with nonspecific ST changes  Recent Labs: No results found for requested labs within last 365 days.   Lipid Panel    Component Value Date/Time   CHOL  11/24/2009 2222    180        ATP III CLASSIFICATION:  <200     mg/dL   Desirable  200-239  mg/dL   Borderline High  >=240    mg/dL   High          TRIG 268 (H) 11/24/2009 2222   HDL 35 (L) 11/24/2009 2222   CHOLHDL 5.1 11/24/2009 2222   VLDL 54 (H) 11/24/2009 2222   LDLCALC  11/24/2009 2222    91        Total Cholesterol/HDL:CHD Risk Coronary Heart Disease Risk Table                     Men   Women  1/2 Average Risk   3.4   3.3  Average Risk       5.0   4.4  2 X Average Risk   9.6   7.1  3 X Average Risk  23.4   11.0        Use the calculated Patient Ratio above and the CHD Risk Table to determine the patient's CHD Risk.  ATP III  CLASSIFICATION (LDL):  <100     mg/dL   Optimal  100-129  mg/dL   Near or Above                    Optimal  130-159  mg/dL   Borderline  160-189  mg/dL   High  >190     mg/dL   Very High    Additional studies/ records that were reviewed today include:  Echo 06/05/16 Study Conclusions   - Left ventricle: The cavity size was normal. Wall thickness was    normal. Systolic function was normal. The estimated ejection    fraction was in the range of 55% to 60%. The study is not    technically sufficient to allow evaluation of LV diastolic    function.  - Mitral valve: Normal appearing mechanical MVR no peri valvular    regurgitation.  - Left atrium: The atrium was mildly dilated.  - Atrial septum: No defect or patent foramen ovale was identified.   -------------------------------------------------------------------  Study data:  Comparison was made to the study of 07/06/2001.  Study  status:  Routine.  Procedure:  The patient reported no pain pre or  post test. Transthoracic echocardiography for left ventricular  function evaluation and for assessment of valvular function. Image  quality was adequate.  Study completion:  There were no  complications.          Transthoracic echocardiography.  M-mode,  complete 2D, spectral Doppler, and color Doppler.  Birthdate:  Patient birthdate: Oct 31, 1945.  Age:  Patient is 76 yr old.  Sex:  Gender: female.    BMI: 21.4 kg/m^2.  Blood pressure:     120/84  Patient status:  Outpatient.  Study date:  Study date: 06/05/2016.  Study time: 03:25 PM.  Location:  [facility]    Risk Assessment/Calculations:    CHA2DS2-VASc Score = 4   This indicates a 4.8% annual risk of stroke. The patient's score is based upon: CHF History: 0 HTN History: 0 Diabetes History: 0 Stroke History: 2 Vascular Disease History: 0 Age Score: 1 Gender Score: 1        ASSESSMENT:    1. Preoperative clearance   2. Mitral valve disorder   3. Palpitations    4. Paroxysmal atrial fibrillation (HCC)   5. Hyperlipidemia, unspecified hyperlipidemia type   6. DOE (dyspnea on exertion)      PLAN:  In order of problems listed above:  Preop clearance for colonoscopy by Dr. Paulita Fujita 12/18/21 will need lovenox bridge. Patient has chronic DOE unchanged associated with heart racing. No chest pain.METs >4 and low risk procedure. Can proceed with colonoscopy without further cardiac workup. Will order echo since last one was 2018 and 2 week monitor to assess HR with activity. Does not have to be done before colonoscopy.  According to the Revised Cardiac Risk Index (RCRI), her Perioperative Risk of Major Cardiac Event is (%): 0.9  Her Functional Capacity in METs is: 5.62 according to the [facility] Activity Status Index (DASI). .  Mechanical mitral valve on Coumadin-will need lovenox bridge. SBE prophylaxis for dental work provided by PCP  PAF-not on rate lowering drugs and having some tachycardia with going up hill or vacuuming.   HLD LDL 63 10/2021 on zocor  Chronic DOE with going up hills associated with tachycardia. Update echo and 2 week monitor  Shared Decision Making/Informed Consent        Medication Adjustments/Labs and Tests Ordered: Current medicines  are reviewed at length with the patient today.  Concerns regarding medicines are outlined above.  Medication changes, Labs and Tests ordered today are listed in the Patient Instructions below. Patient Instructions  Medication Instructions:  Your physician recommends that you continue on your current medications as directed. Please refer to the Current Medication list given to you today.  *If you need a refill on your cardiac medications before your next appointment, please call your pharmacy*   Lab Work: None If you have labs (blood work) drawn today and your tests are completely normal, you will receive your results only by: [place] (if you have MyChart) OR A paper copy in the mail If  you have any lab test that is abnormal or we need to change your treatment, we will call you to review the results.   Testing/Procedures: Your physician has requested that you have an echocardiogram. Echocardiography is a painless test that uses sound waves to create images of your heart. It provides your doctor with information about the size and shape of your heart and how well your heart's chambers and valves are working. This procedure takes approximately one hour. There are no restrictions for this procedure.   Follow-Up: At [facility], you and your health needs are our priority.  As part of our continuing mission to provide you with exceptional heart care, we have created designated Provider Care Teams.  These Care Teams include your primary Cardiologist (physician) and Advanced Practice Providers (APPs -  Physician Assistants and Nurse Practitioners) who all work together to provide you with the care you need, when you need it.   Your next appointment:   1 year(s)  The format for your next appointment:   In Person  Provider:   Larae Grooms, MD {   Other Instructions  [place] Monitor Instructions  Your physician has requested you wear a ZIO patch monitor for 14 days.  This is a single patch monitor. [employer] supplies one patch monitor per enrollment. Additional stickers are not available. Please do not apply patch if you will be having a Nuclear Stress Test,  Echocardiogram, Cardiac CT, MRI, or Chest Xray during the period you would be wearing the  monitor. The patch cannot be worn during these tests. You cannot remove and re-apply the  ZIO XT patch monitor.  Your ZIO patch monitor will be mailed 3 day USPS to your address on file. It may take 3-5 days  to receive your monitor after you have been enrolled.  Once you have received your monitor, please review the enclosed instructions. Your monitor  has already been registered assigning a specific monitor  serial # to you.  Billing and Patient Assistance Program Information  We have supplied [employer] with any of your insurance information on file for billing purposes. [employer] offers a sliding scale Patient Assistance Program for patients that do not have  insurance, or whose insurance does not completely cover the cost of the ZIO monitor.  You must apply for the Patient Assistance Program to qualify for this discounted rate.  To apply, please call [employer] at [phone], select option 4, select option 2, ask to apply for  Patient Assistance Program. Theodore Demark will ask your household income, and how many people  are in your household. They will quote your out-of-pocket cost based on that information.  [employer] will also be able to set up a 68-month interest-free payment plan if needed.  Applying the monitor   Shave hair from upper left  chest.  Hold abrader disc by orange tab. Rub abrader in 40 strokes over the upper left chest as  indicated in your monitor instructions.  Clean area with 4 enclosed alcohol pads. Let dry.  Apply patch as indicated in monitor instructions. Patch will be placed under collarbone on left  side of chest with arrow pointing upward.  Rub patch adhesive wings for 2 minutes. Remove white label marked "1". Remove the white  label marked "2". Rub patch adhesive wings for 2 additional minutes.  While looking in a mirror, press and release button in center of patch. A small green light will  flash 3-4 times. This will be your only indicator that the monitor has been turned on.  Do not shower for the first 24 hours. You may shower after the first 24 hours.  Press the button if you feel a symptom. You will hear a small click. Record Date, Time and  Symptom in the Patient Logbook.  When you are ready to remove the patch, follow instructions on the last 2 pages of Patient  Logbook. Stick patch monitor onto the last page of Patient Logbook.  Place Patient Logbook in the blue  and white box. Use locking tab on box and tape box closed  securely. The blue and white box has prepaid postage on it. Please place it in the mailbox as  soon as possible. Your physician should have your test results approximately 7 days after the  monitor has been mailed back to [employer]  Call [facility] at [phone] if you have questions regarding  your ZIO XT patch monitor. Call them immediately if you see an orange light blinking on your  monitor.  If your monitor falls off in less than 4 days, contact our Monitor department at [phone].  If your monitor becomes loose or falls off after 4 days call [employer] at [phone] for  suggestions on securing your monitor      Signed, Ermalinda Barrios, PA-C  12/10/2021 1:03 PM    [address], [place], [place]  [address] Phone: [phone]; Fax: [phone]

## 2021-12-09 NOTE — Telephone Encounter (Signed)
Called pt to schedule appt with Coumadin Clinic this week to go over Lovenox bridge instructions. No answer, left voicemail.

## 2021-12-10 ENCOUNTER — Ambulatory Visit (INDEPENDENT_AMBULATORY_CARE_PROVIDER_SITE_OTHER): Payer: Medicare Other | Admitting: Physician Assistant

## 2021-12-10 ENCOUNTER — Encounter: Payer: Self-pay | Admitting: Physician Assistant

## 2021-12-10 ENCOUNTER — Ambulatory Visit (INDEPENDENT_AMBULATORY_CARE_PROVIDER_SITE_OTHER): Payer: Medicare Other

## 2021-12-10 VITALS — BP 136/72 | HR 90 | Ht 68.0 in | Wt 140.0 lb

## 2021-12-10 DIAGNOSIS — R002 Palpitations: Secondary | ICD-10-CM | POA: Diagnosis not present

## 2021-12-10 DIAGNOSIS — I480 Paroxysmal atrial fibrillation: Secondary | ICD-10-CM

## 2021-12-10 DIAGNOSIS — R0609 Other forms of dyspnea: Secondary | ICD-10-CM

## 2021-12-10 DIAGNOSIS — I059 Rheumatic mitral valve disease, unspecified: Secondary | ICD-10-CM | POA: Diagnosis not present

## 2021-12-10 DIAGNOSIS — E785 Hyperlipidemia, unspecified: Secondary | ICD-10-CM

## 2021-12-10 DIAGNOSIS — Z01818 Encounter for other preprocedural examination: Secondary | ICD-10-CM

## 2021-12-10 NOTE — Telephone Encounter (Signed)
Called pt since she needs to have an Anticoagulation Clinic appt and needs to receive Lovenox instructions; there was no answer and voicemail is full. Will try again later.

## 2021-12-10 NOTE — Patient Instructions (Signed)
Medication Instructions:  Your physician recommends that you continue on your current medications as directed. Please refer to the Current Medication list given to you today.  *If you need a refill on your cardiac medications before your next appointment, please call your pharmacy*   Lab Work: None If you have labs (blood work) drawn today and your tests are completely normal, you will receive your results only by: [place] (if you have MyChart) OR A paper copy in the mail If you have any lab test that is abnormal or we need to change your treatment, we will call you to review the results.   Testing/Procedures: Your physician has requested that you have an echocardiogram. Echocardiography is a painless test that uses sound waves to create images of your heart. It provides your doctor with information about the size and shape of your heart and how well your heart's chambers and valves are working. This procedure takes approximately one hour. There are no restrictions for this procedure.   Follow-Up: At [facility], you and your health needs are our priority.  As part of our continuing mission to provide you with exceptional heart care, we have created designated Provider Care Teams.  These Care Teams include your primary Cardiologist (physician) and Advanced Practice Providers (APPs -  Physician Assistants and Nurse Practitioners) who all work together to provide you with the care you need, when you need it.   Your next appointment:   1 year(s)  The format for your next appointment:   In Person  Provider:   Larae Grooms, MD {   Other Instructions  [facility] Monitor Instructions  Your physician has requested you wear a ZIO patch monitor for 14 days.  This is a single patch monitor. [employer] supplies one patch monitor per enrollment. Additional stickers are not available. Please do not apply patch if you will be having a Nuclear Stress Test,  Echocardiogram,  Cardiac CT, MRI, or Chest Xray during the period you would be wearing the  monitor. The patch cannot be worn during these tests. You cannot remove and re-apply the  ZIO XT patch monitor.  Your ZIO patch monitor will be mailed 3 day USPS to your address on file. It may take 3-5 days  to receive your monitor after you have been enrolled.  Once you have received your monitor, please review the enclosed instructions. Your monitor  has already been registered assigning a specific monitor serial # to you.  Billing and Patient Assistance Program Information  We have supplied [employer] with any of your insurance information on file for billing purposes. [employer] offers a sliding scale Patient Assistance Program for patients that do not have  insurance, or whose insurance does not completely cover the cost of the ZIO monitor.  You must apply for the Patient Assistance Program to qualify for this discounted rate.  To apply, please call [employer] at [phone], select option 4, select option 2, ask to apply for  Patient Assistance Program. Theodore Demark will ask your household income, and how many people  are in your household. They will quote your out-of-pocket cost based on that information.  [employer] will also be able to set up a 38-month interest-free payment plan if needed.  Applying the monitor   Shave hair from upper left chest.  Hold abrader disc by orange tab. Rub abrader in 40 strokes over the upper left chest as  indicated in your monitor instructions.  Clean area with 4 enclosed alcohol pads. Let  dry.  Apply patch as indicated in monitor instructions. Patch will be placed under collarbone on left  side of chest with arrow pointing upward.  Rub patch adhesive wings for 2 minutes. Remove white label marked "1". Remove the white  label marked "2". Rub patch adhesive wings for 2 additional minutes.  While looking in a mirror, press and release button in center of patch. A small green light will   flash 3-4 times. This will be your only indicator that the monitor has been turned on.  Do not shower for the first 24 hours. You may shower after the first 24 hours.  Press the button if you feel a symptom. You will hear a small click. Record Date, Time and  Symptom in the Patient Logbook.  When you are ready to remove the patch, follow instructions on the last 2 pages of Patient  Logbook. Stick patch monitor onto the last page of Patient Logbook.  Place Patient Logbook in the blue and white box. Use locking tab on box and tape box closed  securely. The blue and white box has prepaid postage on it. Please place it in the mailbox as  soon as possible. Your physician should have your test results approximately 7 days after the  monitor has been mailed back to [facility].  Call [facility] at [phone] if you have questions regarding  your ZIO XT patch monitor. Call them immediately if you see an orange light blinking on your  monitor.  If your monitor falls off in less than 4 days, contact our Monitor department at [phone].  If your monitor becomes loose or falls off after 4 days call [employer] at [phone] for  suggestions on securing your monitor

## 2021-12-10 NOTE — Telephone Encounter (Signed)
Placed a note on Cardiologist appt for today & they were able to assist with getting pt set up for an appointment tomorrow at 230pm.

## 2021-12-10 NOTE — Progress Notes (Unsigned)
Enrolled for [employer] to mail a ZIO XT long term holter monitor to the patients address on file.   Dr. Varanasi to read. 

## 2021-12-11 ENCOUNTER — Ambulatory Visit (INDEPENDENT_AMBULATORY_CARE_PROVIDER_SITE_OTHER): Payer: Medicare Other | Admitting: *Deleted

## 2021-12-11 DIAGNOSIS — I059 Rheumatic mitral valve disease, unspecified: Secondary | ICD-10-CM | POA: Diagnosis not present

## 2021-12-11 DIAGNOSIS — Z7901 Long term (current) use of anticoagulants: Secondary | ICD-10-CM

## 2021-12-11 LAB — POCT INR: INR: 3.4 — AB (ref 2.0–3.0)

## 2021-12-11 NOTE — Patient Instructions (Addendum)
Description   Do not take today's dose then start taking the dose you should be taking with is 1 tablet daily except 1.5 tablets on Wednesdays. Recheck in 3 weeks. Call us with medication changes [phone]. LET us KNOW WHEN COLONOSCOPY RESCHEDULED

## 2021-12-19 ENCOUNTER — Ambulatory Visit (HOSPITAL_COMMUNITY): Payer: Medicare Other | Attending: Cardiology

## 2021-12-19 DIAGNOSIS — Z954 Presence of other heart-valve replacement: Secondary | ICD-10-CM | POA: Diagnosis not present

## 2021-12-19 DIAGNOSIS — I059 Rheumatic mitral valve disease, unspecified: Secondary | ICD-10-CM | POA: Insufficient documentation

## 2021-12-19 LAB — ECHOCARDIOGRAM COMPLETE
Area-P 1/2: 3.21 cm2
MV VTI: 1.75 cm2
S' Lateral: 2.7 cm

## 2021-12-23 DIAGNOSIS — R002 Palpitations: Secondary | ICD-10-CM

## 2021-12-31 ENCOUNTER — Ambulatory Visit: Payer: Medicare Other | Attending: Interventional Cardiology

## 2021-12-31 DIAGNOSIS — I059 Rheumatic mitral valve disease, unspecified: Secondary | ICD-10-CM | POA: Diagnosis not present

## 2021-12-31 DIAGNOSIS — Z7901 Long term (current) use of anticoagulants: Secondary | ICD-10-CM

## 2021-12-31 LAB — POCT INR: INR: 2 (ref 2.0–3.0)

## 2021-12-31 NOTE — Patient Instructions (Signed)
Description   Take 2 tablets today and then continue taking 1 tablet daily except 1.5 tablets on Wednesdays.  Recheck in 2 weeks.  Call us with medication changes [phone]. LET us KNOW WHEN COLONOSCOPY RESCHEDULED

## 2022-01-01 NOTE — Telephone Encounter (Signed)
Fax received today from requesting office the procedure will most likely be in Nov per the clearance request. I will update the pre op provider and the pre op pharm-d.

## 2022-01-13 DIAGNOSIS — R002 Palpitations: Secondary | ICD-10-CM | POA: Diagnosis not present

## 2022-01-14 ENCOUNTER — Ambulatory Visit: Payer: Medicare Other

## 2022-01-20 ENCOUNTER — Ambulatory Visit: Payer: Medicare Other

## 2022-01-22 ENCOUNTER — Ambulatory Visit: Payer: Medicare Other | Attending: Interventional Cardiology

## 2022-01-22 DIAGNOSIS — Z7901 Long term (current) use of anticoagulants: Secondary | ICD-10-CM

## 2022-01-22 DIAGNOSIS — I059 Rheumatic mitral valve disease, unspecified: Secondary | ICD-10-CM

## 2022-01-22 LAB — POCT INR: INR: 3.5 — AB (ref 2.0–3.0)

## 2022-01-22 NOTE — Patient Instructions (Signed)
Description   Only take 0.5 tablet today and then continue taking 1 tablet daily except 1.5 tablets on Wednesdays.  Stay consistent with greens (2 times per week)  Recheck in 2 weeks.  Call us with medication changes [phone]. LET us KNOW WHEN COLONOSCOPY RESCHEDULED

## 2022-02-05 ENCOUNTER — Ambulatory Visit: Payer: Medicare Other | Attending: Interventional Cardiology

## 2022-02-05 DIAGNOSIS — Z5181 Encounter for therapeutic drug level monitoring: Secondary | ICD-10-CM | POA: Diagnosis not present

## 2022-02-05 DIAGNOSIS — I059 Rheumatic mitral valve disease, unspecified: Secondary | ICD-10-CM

## 2022-02-05 DIAGNOSIS — Z7901 Long term (current) use of anticoagulants: Secondary | ICD-10-CM

## 2022-02-05 LAB — POCT INR: INR: 2.3 (ref 2.0–3.0)

## 2022-02-05 NOTE — Patient Instructions (Signed)
TAKE 1.5 TODAY ONLY and then continue taking 1 tablet daily except 1.5 tablets on Wednesdays.  Stay consistent with greens (2 times per week)  Recheck in 3 weeks.  Call us with medication changes [phone]. LET us KNOW WHEN COLONOSCOPY RESCHEDULED

## 2022-02-09 ENCOUNTER — Other Ambulatory Visit: Payer: Self-pay

## 2022-02-09 ENCOUNTER — Emergency Department (HOSPITAL_COMMUNITY)
Admission: EM | Admit: 2022-02-09 | Discharge: 2022-02-10 | Discharge status: Against Medical Advice | Payer: Medicare Other | Attending: Emergency Medicine | Admitting: Emergency Medicine

## 2022-02-09 DIAGNOSIS — R410 Disorientation, unspecified: Secondary | ICD-10-CM | POA: Insufficient documentation

## 2022-02-09 DIAGNOSIS — R519 Headache, unspecified: Secondary | ICD-10-CM | POA: Diagnosis not present

## 2022-02-09 DIAGNOSIS — Z5321 Procedure and treatment not carried out due to patient leaving prior to being seen by health care provider: Secondary | ICD-10-CM | POA: Diagnosis not present

## 2022-02-09 DIAGNOSIS — I10 Essential (primary) hypertension: Secondary | ICD-10-CM | POA: Diagnosis not present

## 2022-02-09 DIAGNOSIS — R791 Abnormal coagulation profile: Secondary | ICD-10-CM | POA: Insufficient documentation

## 2022-02-10 ENCOUNTER — Emergency Department (HOSPITAL_COMMUNITY): Payer: Medicare Other

## 2022-02-10 ENCOUNTER — Encounter (HOSPITAL_COMMUNITY): Payer: Self-pay | Admitting: *Deleted

## 2022-02-10 ENCOUNTER — Other Ambulatory Visit: Payer: Self-pay

## 2022-02-10 DIAGNOSIS — R519 Headache, unspecified: Secondary | ICD-10-CM | POA: Diagnosis not present

## 2022-02-10 DIAGNOSIS — I10 Essential (primary) hypertension: Secondary | ICD-10-CM | POA: Diagnosis not present

## 2022-02-10 LAB — CBC WITH DIFFERENTIAL/PLATELET
Abs Immature Granulocytes: 0.09 10*3/uL — ABNORMAL HIGH (ref 0.00–0.07)
Basophils Absolute: 0 10*3/uL (ref 0.0–0.1)
Basophils Relative: 0 %
Eosinophils Absolute: 0.1 10*3/uL (ref 0.0–0.5)
Eosinophils Relative: 0 %
HCT: 40.4 % (ref 36.0–46.0)
Hemoglobin: 14.2 g/dL (ref 12.0–15.0)
Immature Granulocytes: 1 %
Lymphocytes Relative: 7 %
Lymphs Abs: 1.1 10*3/uL (ref 0.7–4.0)
MCH: 29.6 pg (ref 26.0–34.0)
MCHC: 35.1 g/dL (ref 30.0–36.0)
MCV: 84.3 fL (ref 80.0–100.0)
Monocytes Absolute: 1.1 10*3/uL — ABNORMAL HIGH (ref 0.1–1.0)
Monocytes Relative: 7 %
Neutro Abs: 13.3 10*3/uL — ABNORMAL HIGH (ref 1.7–7.7)
Neutrophils Relative %: 85 %
Platelets: 207 10*3/uL (ref 150–400)
RBC: 4.79 MIL/uL (ref 3.87–5.11)
RDW: 12.9 % (ref 11.5–15.5)
WBC: 15.6 10*3/uL — ABNORMAL HIGH (ref 4.0–10.5)
nRBC: 0 % (ref 0.0–0.2)

## 2022-02-10 LAB — URINALYSIS, ROUTINE W REFLEX MICROSCOPIC
Bacteria, UA: NONE SEEN
Bilirubin Urine: NEGATIVE
Glucose, UA: NEGATIVE mg/dL
Ketones, ur: NEGATIVE mg/dL
Leukocytes,Ua: NEGATIVE
Nitrite: NEGATIVE
Protein, ur: 30 mg/dL — AB
Specific Gravity, Urine: 1.012 (ref 1.005–1.030)
pH: 5 (ref 5.0–8.0)

## 2022-02-10 LAB — PROTIME-INR
INR: 1.9 — ABNORMAL HIGH (ref 0.8–1.2)
Prothrombin Time: 21.8 seconds — ABNORMAL HIGH (ref 11.4–15.2)

## 2022-02-10 LAB — BASIC METABOLIC PANEL
Anion gap: 13 (ref 5–15)
BUN: 13 mg/dL (ref 8–23)
CO2: 19 mmol/L — ABNORMAL LOW (ref 22–32)
Calcium: 9.1 mg/dL (ref 8.9–10.3)
Chloride: 105 mmol/L (ref 98–111)
Creatinine, Ser: 0.87 mg/dL (ref 0.44–1.00)
GFR, Estimated: >60 mL/min (ref >60)
Glucose, Bld: 139 mg/dL — ABNORMAL HIGH (ref 70–99)
Potassium: 4 mmol/L (ref 3.5–5.1)
Sodium: 137 mmol/L (ref 135–145)

## 2022-02-10 NOTE — ED Triage Notes (Signed)
Pt says her BP was high 167/101 at home, she did have a headache for about 1 hour PTA (has subsided). Her family says that she called her and she arrived to her home and she was somewhat disoriented, seems better now. She is taken all of her medication as prescribed. She is HOH.

## 2022-02-10 NOTE — ED Provider Triage Note (Signed)
Emergency Medicine Provider Triage Evaluation Note  Jasmine Kelley , a 76 y.o. female  was evaluated in triage.  Pt complains of hypertension, notes that her blood pressure is high today, states she she had a slight headache is in the back of her head, she states that she was seeing some shortening objects within her peripheral vision but no actual loss of vision denies any paresthesia or weakness in the upper or lower extremities, no recent head trauma, not anticoag's.  Symptoms lasted approximate hour then resolved, she currently has no symptoms at this time, has a history of TIAs without any focal deficits.  She is currently on warfarin for heart valve..  Review of Systems  Positive: High blood pressure headache Negative: Paresthesias, weakness  Physical Exam  BP (!) 156/81 (BP Location: Left Arm)   Pulse (!) 107   Temp 99 F (37.2 C) (Oral)   Resp 18   SpO2 95%  Gen:   Awake, no distress   Resp:  Normal effort  MSK:   Moves extremities without difficulty  Other:  Cranial nerves II through XII grossly intact no difficulty with word finding following two-step commands no regular weakness present gait fully intact.  Medical Decision Making  Medically screening exam initiated at 12:39 AM.  Appropriate orders placed.  Orson Aloe was informed that the remainder of the evaluation will be completed by another provider, this initial triage assessment does not replace that evaluation, and the importance of remaining in the ED until their evaluation is complete.  Lab work imaging been ordered will need further work-up.   Marcello Fennel, PA-C 02/10/22 0041

## 2022-02-10 NOTE — ED Notes (Signed)
Patient left on own accord °

## 2022-02-12 DIAGNOSIS — R7309 Other abnormal glucose: Secondary | ICD-10-CM | POA: Diagnosis not present

## 2022-02-12 DIAGNOSIS — I10 Essential (primary) hypertension: Secondary | ICD-10-CM | POA: Diagnosis not present

## 2022-02-12 DIAGNOSIS — Z8673 Personal history of transient ischemic attack (TIA), and cerebral infarction without residual deficits: Secondary | ICD-10-CM | POA: Diagnosis not present

## 2022-02-12 DIAGNOSIS — D72829 Elevated white blood cell count, unspecified: Secondary | ICD-10-CM | POA: Diagnosis not present

## 2022-02-12 DIAGNOSIS — I482 Chronic atrial fibrillation, unspecified: Secondary | ICD-10-CM | POA: Diagnosis not present

## 2022-02-12 DIAGNOSIS — I739 Peripheral vascular disease, unspecified: Secondary | ICD-10-CM | POA: Diagnosis not present

## 2022-02-12 DIAGNOSIS — J0100 Acute maxillary sinusitis, unspecified: Secondary | ICD-10-CM | POA: Diagnosis not present

## 2022-02-12 DIAGNOSIS — D6869 Other thrombophilia: Secondary | ICD-10-CM | POA: Diagnosis not present

## 2022-02-12 DIAGNOSIS — I270 Primary pulmonary hypertension: Secondary | ICD-10-CM | POA: Diagnosis not present

## 2022-02-23 ENCOUNTER — Ambulatory Visit: Payer: Medicare Other | Attending: Interventional Cardiology

## 2022-02-23 DIAGNOSIS — I059 Rheumatic mitral valve disease, unspecified: Secondary | ICD-10-CM | POA: Diagnosis not present

## 2022-02-23 DIAGNOSIS — Z7901 Long term (current) use of anticoagulants: Secondary | ICD-10-CM

## 2022-02-23 LAB — POCT INR: INR: 2.7 (ref 2.0–3.0)

## 2022-02-23 NOTE — Patient Instructions (Signed)
Description   Continue taking 1 tablet daily except 1.5 tablets on Wednesdays.  Stay consistent with greens (2 times per week)  Recheck in 2 weeks.  Call us with medication changes [phone]. LET us KNOW WHEN COLONOSCOPY RESCHEDULED

## 2022-03-09 ENCOUNTER — Ambulatory Visit: Payer: Medicare Other | Attending: Interventional Cardiology

## 2022-03-09 DIAGNOSIS — I059 Rheumatic mitral valve disease, unspecified: Secondary | ICD-10-CM | POA: Diagnosis not present

## 2022-03-09 DIAGNOSIS — Z7901 Long term (current) use of anticoagulants: Secondary | ICD-10-CM

## 2022-03-09 LAB — POCT INR: INR: 3.5 — AB (ref 2.0–3.0)

## 2022-03-09 NOTE — Patient Instructions (Signed)
Description   Hold today's dose and then continue taking 1 tablet daily except 1.5 tablets on Wednesdays.  Stay consistent with greens (2 times per week)  Recheck in 2 weeks.  Call us with medication changes [phone]. LET us KNOW WHEN COLONOSCOPY RESCHEDULED

## 2022-03-23 ENCOUNTER — Ambulatory Visit: Payer: Medicare Other

## 2022-04-01 ENCOUNTER — Ambulatory Visit: Payer: Medicare Other | Attending: Interventional Cardiology

## 2022-04-01 DIAGNOSIS — Z7901 Long term (current) use of anticoagulants: Secondary | ICD-10-CM

## 2022-04-01 DIAGNOSIS — I059 Rheumatic mitral valve disease, unspecified: Secondary | ICD-10-CM

## 2022-04-01 LAB — POCT INR: INR: 2.2 (ref 2.0–3.0)

## 2022-04-01 NOTE — Patient Instructions (Signed)
Description   Take 2 tablets today and then continue taking 1 tablet daily except 1.5 tablets on Wednesdays.  Stay consistent with greens (2 times per week)  Recheck in 2 weeks.  Call us with medication changes [phone]. LET us KNOW WHEN COLONOSCOPY RESCHEDULED

## 2022-04-20 ENCOUNTER — Ambulatory Visit: Payer: Medicare Other

## 2022-05-05 ENCOUNTER — Other Ambulatory Visit: Payer: Self-pay | Admitting: *Deleted

## 2022-05-05 MED ORDER — WARFARIN SODIUM 2 MG PO TABS: ORAL_TABLET | ORAL | 0 refills | Status: DC

## 2022-05-05 NOTE — Telephone Encounter (Addendum)
Prescription refill request received for warfarin Lov: lenze Next INR check: 05/12/2022 Warfarin tablet strength: '2mg'$ 

## 2022-05-07 DIAGNOSIS — E039 Hypothyroidism, unspecified: Secondary | ICD-10-CM | POA: Diagnosis not present

## 2022-05-07 DIAGNOSIS — I482 Chronic atrial fibrillation, unspecified: Secondary | ICD-10-CM | POA: Diagnosis not present

## 2022-05-07 DIAGNOSIS — Z8673 Personal history of transient ischemic attack (TIA), and cerebral infarction without residual deficits: Secondary | ICD-10-CM | POA: Diagnosis not present

## 2022-05-07 DIAGNOSIS — I10 Essential (primary) hypertension: Secondary | ICD-10-CM | POA: Diagnosis not present

## 2022-05-07 DIAGNOSIS — I270 Primary pulmonary hypertension: Secondary | ICD-10-CM | POA: Diagnosis not present

## 2022-05-07 DIAGNOSIS — M542 Cervicalgia: Secondary | ICD-10-CM | POA: Diagnosis not present

## 2022-05-07 DIAGNOSIS — H9190 Unspecified hearing loss, unspecified ear: Secondary | ICD-10-CM | POA: Diagnosis not present

## 2022-05-07 DIAGNOSIS — M810 Age-related osteoporosis without current pathological fracture: Secondary | ICD-10-CM | POA: Diagnosis not present

## 2022-05-07 DIAGNOSIS — D6869 Other thrombophilia: Secondary | ICD-10-CM | POA: Diagnosis not present

## 2022-05-07 DIAGNOSIS — I7300 Raynaud's syndrome without gangrene: Secondary | ICD-10-CM | POA: Diagnosis not present

## 2022-05-12 ENCOUNTER — Ambulatory Visit: Payer: Medicare Other

## 2022-05-13 ENCOUNTER — Other Ambulatory Visit: Payer: Self-pay | Admitting: Interventional Cardiology

## 2022-05-13 ENCOUNTER — Ambulatory Visit: Payer: Medicare Other

## 2022-05-13 ENCOUNTER — Other Ambulatory Visit: Payer: Self-pay

## 2022-05-13 DIAGNOSIS — I480 Paroxysmal atrial fibrillation: Secondary | ICD-10-CM

## 2022-05-13 MED ORDER — WARFARIN SODIUM 2 MG PO TABS: ORAL_TABLET | ORAL | 0 refills | Status: DC

## 2022-05-13 NOTE — Telephone Encounter (Signed)
Prescription refill request received for warfarin Lov: 12/10/21 Jasmine Kelley)  Next INR check: 04/20/22 Warfarin tablet strength: '2mg'$   Overdue for INR check. Will refill at [facility] appt.

## 2022-05-13 NOTE — Telephone Encounter (Signed)
Prescription refill request received for warfarin Lov: 12/10/21 [name])  Next INR check: 04/20/22 - overdue. Pt has scheduled appt for 05/19/22 Warfarin tablet strength: '2mg'$   Refill sent to pharmacy to last until upcoming appt.

## 2022-05-15 ENCOUNTER — Other Ambulatory Visit: Payer: Self-pay | Admitting: Internal Medicine

## 2022-05-15 DIAGNOSIS — M810 Age-related osteoporosis without current pathological fracture: Secondary | ICD-10-CM

## 2022-05-19 ENCOUNTER — Other Ambulatory Visit: Payer: Self-pay | Admitting: Interventional Cardiology

## 2022-05-19 ENCOUNTER — Ambulatory Visit: Payer: Medicare Other | Attending: Cardiology

## 2022-05-19 DIAGNOSIS — Z7901 Long term (current) use of anticoagulants: Secondary | ICD-10-CM | POA: Diagnosis not present

## 2022-05-19 DIAGNOSIS — I059 Rheumatic mitral valve disease, unspecified: Secondary | ICD-10-CM | POA: Diagnosis not present

## 2022-05-19 DIAGNOSIS — Z5181 Encounter for therapeutic drug level monitoring: Secondary | ICD-10-CM | POA: Diagnosis not present

## 2022-05-19 DIAGNOSIS — I480 Paroxysmal atrial fibrillation: Secondary | ICD-10-CM

## 2022-05-19 LAB — POCT INR: INR: 1.9 — AB (ref 2.0–3.0)

## 2022-05-19 NOTE — Patient Instructions (Signed)
Take 2 tablets today and then continue taking 1 tablet daily except 1.5 tablets on Wednesdays.  Stay consistent with greens (2 times per week)  Recheck in 3 weeks.  Call us with medication changes [phone]. LET us KNOW WHEN COLONOSCOPY RESCHEDULED

## 2022-05-19 NOTE — Telephone Encounter (Signed)
Pt overdue for Anticoagulation Appt. She has an appt pending today. Will address refill at that time.  Last OV 12/10/2021

## 2022-06-08 ENCOUNTER — Ambulatory Visit: Payer: 59

## 2022-06-22 ENCOUNTER — Ambulatory Visit: Payer: 59 | Attending: Cardiovascular Disease | Admitting: *Deleted

## 2022-06-22 DIAGNOSIS — Z7901 Long term (current) use of anticoagulants: Secondary | ICD-10-CM | POA: Diagnosis not present

## 2022-06-22 DIAGNOSIS — I059 Rheumatic mitral valve disease, unspecified: Secondary | ICD-10-CM | POA: Diagnosis not present

## 2022-06-22 LAB — POCT INR: INR: 2.5 (ref 2.0–3.0)

## 2022-06-22 NOTE — Patient Instructions (Signed)
Description   Continue taking warfarin 1 tablet daily except 1.5 tablets on Wednesdays. Stay consistent with greens (2 times per week). Recheck in 4 weeks.  Call us with medication changes [phone]. LET us KNOW WHEN COLONOSCOPY RESCHEDULED

## 2022-07-20 ENCOUNTER — Ambulatory Visit: Payer: 59 | Attending: Interventional Cardiology | Admitting: *Deleted

## 2022-07-20 DIAGNOSIS — Z7901 Long term (current) use of anticoagulants: Secondary | ICD-10-CM

## 2022-07-20 DIAGNOSIS — I059 Rheumatic mitral valve disease, unspecified: Secondary | ICD-10-CM | POA: Diagnosis not present

## 2022-07-20 LAB — POCT INR: INR: 2.6 (ref 2.0–3.0)

## 2022-07-20 NOTE — Patient Instructions (Signed)
Description   Continue taking warfarin 1 tablet daily except 1.5 tablets on Wednesdays. Stay consistent with greens (2 times per week). Recheck in 4 weeks.  Call us with medication changes #[phone]. LET US KNOW WHEN COLONOSCOPY RESCHEDULED     

## 2022-08-17 ENCOUNTER — Ambulatory Visit: Payer: 59 | Attending: Interventional Cardiology | Admitting: Pharmacist

## 2022-08-17 DIAGNOSIS — Z7901 Long term (current) use of anticoagulants: Secondary | ICD-10-CM | POA: Diagnosis not present

## 2022-08-17 DIAGNOSIS — I059 Rheumatic mitral valve disease, unspecified: Secondary | ICD-10-CM | POA: Diagnosis not present

## 2022-08-17 LAB — POCT INR: POC INR: 2.5

## 2022-08-17 NOTE — Patient Instructions (Signed)
Continue taking warfarin 1 tablet daily except 1.5 tablets on Wednesdays. Stay consistent with greens (2 times per week). Recheck in 5 weeks.  Call us with medication changes #[phone]. LET us KNOW WHEN COLONOSCOPY RESCHEDULED

## 2022-08-20 ENCOUNTER — Other Ambulatory Visit: Payer: Self-pay | Admitting: Interventional Cardiology

## 2022-08-20 DIAGNOSIS — I480 Paroxysmal atrial fibrillation: Secondary | ICD-10-CM

## 2022-08-20 NOTE — Telephone Encounter (Signed)
Prescription refill request received for warfarin Lov: Lenze, 12/10/2021 Next INR check:5/20 Warfarin tablet strength: 

## 2022-09-21 ENCOUNTER — Ambulatory Visit: Payer: 59 | Attending: Interventional Cardiology | Admitting: Pharmacist

## 2022-09-21 DIAGNOSIS — I059 Rheumatic mitral valve disease, unspecified: Secondary | ICD-10-CM | POA: Diagnosis not present

## 2022-09-21 DIAGNOSIS — Z7901 Long term (current) use of anticoagulants: Secondary | ICD-10-CM | POA: Diagnosis not present

## 2022-09-21 DIAGNOSIS — Z952 Presence of prosthetic heart valve: Secondary | ICD-10-CM

## 2022-09-21 LAB — POCT INR: INR: 2.4 (ref 2.0–3.0)

## 2022-09-21 NOTE — Patient Instructions (Signed)
Description   Take 1 and 1/2 tablets tonight only and then continue taking warfarin 1 tablet daily except 1.5 tablets on Wednesdays. Stay consistent with greens (2 times per week). Recheck in 5 weeks.  Call us with medication changes #[phone].

## 2022-10-26 ENCOUNTER — Ambulatory Visit: Payer: 59

## 2022-10-26 ENCOUNTER — Telehealth: Payer: Self-pay | Admitting: *Deleted

## 2022-10-26 NOTE — Telephone Encounter (Signed)
Returned call to the dtr regarding her voicemail to change pt's appt. Dtr is unable to bring pt and rescheduled to an opening on tomorrow.

## 2022-10-27 ENCOUNTER — Ambulatory Visit: Payer: 59 | Attending: Cardiology | Admitting: *Deleted

## 2022-10-27 DIAGNOSIS — I059 Rheumatic mitral valve disease, unspecified: Secondary | ICD-10-CM

## 2022-10-27 DIAGNOSIS — Z7901 Long term (current) use of anticoagulants: Secondary | ICD-10-CM

## 2022-10-27 LAB — POCT INR: INR: 1.8 — AB (ref 2.0–3.0)

## 2022-10-27 NOTE — Patient Instructions (Signed)
Description   Take 1 and 1/2 tablets tonight only and then START taking warfarin 1 tablet daily except 1.5 tablets on Sundays and Wednesdays. Stay consistent with greens (2 times per week). Recheck in 3 weeks.  Call us with medication changes #[phone].

## 2022-11-17 ENCOUNTER — Ambulatory Visit: Payer: 59 | Attending: Interventional Cardiology

## 2022-11-17 DIAGNOSIS — Z7901 Long term (current) use of anticoagulants: Secondary | ICD-10-CM

## 2022-11-17 DIAGNOSIS — I059 Rheumatic mitral valve disease, unspecified: Secondary | ICD-10-CM

## 2022-11-17 LAB — POCT INR: INR: 2.8 (ref 2.0–3.0)

## 2022-11-17 NOTE — Patient Instructions (Signed)
Continue warfarin 1 tablet daily except 1.5 tablets on Sundays and Wednesdays. Stay consistent with greens (2 times per week). Recheck in 4 weeks.  Call us with medication changes #[phone].

## 2022-11-22 ENCOUNTER — Other Ambulatory Visit: Payer: Self-pay | Admitting: Interventional Cardiology

## 2022-11-22 DIAGNOSIS — I480 Paroxysmal atrial fibrillation: Secondary | ICD-10-CM

## 2022-12-15 ENCOUNTER — Ambulatory Visit: Payer: 59 | Admitting: *Deleted

## 2022-12-15 DIAGNOSIS — Z7901 Long term (current) use of anticoagulants: Secondary | ICD-10-CM | POA: Diagnosis not present

## 2022-12-15 DIAGNOSIS — Z5181 Encounter for therapeutic drug level monitoring: Secondary | ICD-10-CM | POA: Diagnosis not present

## 2022-12-15 DIAGNOSIS — I059 Rheumatic mitral valve disease, unspecified: Secondary | ICD-10-CM

## 2022-12-15 LAB — POCT INR: POC INR: 3.2

## 2022-12-15 NOTE — Patient Instructions (Addendum)
Description   Take 1/2 a tablet of warfarin today and then continue warfarin 1 tablet daily except 1.5 tablets on Sundays and Wednesdays. Stay consistent with greens (2 times per week). Recheck in 3 weeks.  Call us with medication changes #[phone].

## 2023-01-05 ENCOUNTER — Ambulatory Visit: Payer: 59

## 2023-01-05 ENCOUNTER — Telehealth: Payer: Self-pay | Admitting: *Deleted

## 2023-01-05 NOTE — Telephone Encounter (Signed)
Called pt's dtr since pt missed appt today. Dtr Jasmine Kelley, rescheduled appt to next week.

## 2023-01-13 ENCOUNTER — Ambulatory Visit: Payer: 59 | Attending: Internal Medicine

## 2023-01-13 DIAGNOSIS — Z7901 Long term (current) use of anticoagulants: Secondary | ICD-10-CM

## 2023-01-13 DIAGNOSIS — I059 Rheumatic mitral valve disease, unspecified: Secondary | ICD-10-CM | POA: Diagnosis not present

## 2023-01-13 LAB — POCT INR: INR: 3 (ref 2.0–3.0)

## 2023-01-13 NOTE — Patient Instructions (Signed)
Description   Continue warfarin 1 tablet daily except 2 tablets on Wednesdays.  Stay consistent with greens (2 times per week).  Recheck in 4 weeks.  Call us with medication changes #[phone].

## 2023-02-15 ENCOUNTER — Ambulatory Visit: Payer: 59

## 2023-02-19 ENCOUNTER — Ambulatory Visit: Payer: 59 | Attending: Internal Medicine

## 2023-02-19 DIAGNOSIS — Z7901 Long term (current) use of anticoagulants: Secondary | ICD-10-CM | POA: Diagnosis not present

## 2023-02-19 DIAGNOSIS — I059 Rheumatic mitral valve disease, unspecified: Secondary | ICD-10-CM | POA: Diagnosis not present

## 2023-02-19 LAB — POCT INR: INR: 4.1 — AB (ref 2.0–3.0)

## 2023-02-19 NOTE — Patient Instructions (Signed)
HOLD TODAY ONLY THEN Continue warfarin 1 tablet daily except 2 tablets on Wednesdays.  Stay consistent with greens (2 times per week).  Recheck in 3 weeks.  Call us with medication changes #[phone].

## 2023-03-11 ENCOUNTER — Telehealth: Payer: Self-pay | Admitting: Physician Assistant

## 2023-03-11 ENCOUNTER — Ambulatory Visit: Payer: 59 | Attending: Cardiology

## 2023-03-11 DIAGNOSIS — Z7901 Long term (current) use of anticoagulants: Secondary | ICD-10-CM | POA: Diagnosis not present

## 2023-03-11 DIAGNOSIS — I480 Paroxysmal atrial fibrillation: Secondary | ICD-10-CM

## 2023-03-11 DIAGNOSIS — I10 Essential (primary) hypertension: Secondary | ICD-10-CM | POA: Diagnosis not present

## 2023-03-11 DIAGNOSIS — E782 Mixed hyperlipidemia: Secondary | ICD-10-CM | POA: Diagnosis not present

## 2023-03-11 DIAGNOSIS — E039 Hypothyroidism, unspecified: Secondary | ICD-10-CM | POA: Diagnosis not present

## 2023-03-11 DIAGNOSIS — Z0000 Encounter for general adult medical examination without abnormal findings: Secondary | ICD-10-CM | POA: Diagnosis not present

## 2023-03-11 DIAGNOSIS — I7300 Raynaud's syndrome without gangrene: Secondary | ICD-10-CM | POA: Diagnosis not present

## 2023-03-11 DIAGNOSIS — I272 Pulmonary hypertension, unspecified: Secondary | ICD-10-CM | POA: Diagnosis not present

## 2023-03-11 DIAGNOSIS — I059 Rheumatic mitral valve disease, unspecified: Secondary | ICD-10-CM | POA: Diagnosis not present

## 2023-03-11 DIAGNOSIS — G629 Polyneuropathy, unspecified: Secondary | ICD-10-CM | POA: Diagnosis not present

## 2023-03-11 DIAGNOSIS — D6869 Other thrombophilia: Secondary | ICD-10-CM | POA: Diagnosis not present

## 2023-03-11 LAB — POCT INR: INR: 3 (ref 2.0–3.0)

## 2023-03-11 MED ORDER — WARFARIN SODIUM 2 MG PO TABS: ORAL_TABLET | ORAL | 0 refills | Status: DC

## 2023-03-11 NOTE — Telephone Encounter (Signed)
Refill request for warfarin:  Last INR was 3.0 on 03/11/23 Next INR due 04/08/23 LOV was 12/10/21  Refill approved.  Pt is past due for MD appt.  Message sent to schedulers to make appt.

## 2023-03-11 NOTE — Telephone Encounter (Signed)
*  STAT* If patient is at the pharmacy, call can be transferred to refill team.   1. Which medications need to be refilled? (please list name of each medication and dose if known) warfarin (COUMADIN) 2 MG tablet    2. Would you like to learn more about the convenience, safety, & potential cost savings by using the [facility]? No     3. Are you open to using the [facility] (Type [facility]. No ).   4. Which pharmacy/location (including street and city if local pharmacy) is medication to be sent to? [id number] [place], [place] - [address]    5. Do they need a 30 day or 90 day supply? 90

## 2023-03-11 NOTE — Patient Instructions (Signed)
Description   Continue warfarin 1 tablet daily except 2 tablets on Wednesdays.  Stay consistent with greens (2 times per week).  Recheck in 4 weeks.  Call us with medication changes #[phone].

## 2023-03-24 ENCOUNTER — Ambulatory Visit: Payer: 59 | Admitting: Podiatry

## 2023-04-08 ENCOUNTER — Ambulatory Visit: Payer: 59

## 2023-04-12 DIAGNOSIS — R945 Abnormal results of liver function studies: Secondary | ICD-10-CM | POA: Diagnosis not present

## 2023-04-12 DIAGNOSIS — E039 Hypothyroidism, unspecified: Secondary | ICD-10-CM | POA: Diagnosis not present

## 2023-04-15 ENCOUNTER — Ambulatory Visit: Payer: 59 | Attending: Cardiovascular Disease

## 2023-04-15 DIAGNOSIS — Z7901 Long term (current) use of anticoagulants: Secondary | ICD-10-CM | POA: Diagnosis not present

## 2023-04-15 DIAGNOSIS — I059 Rheumatic mitral valve disease, unspecified: Secondary | ICD-10-CM | POA: Diagnosis not present

## 2023-04-15 LAB — POCT INR: INR: 3.2 — AB (ref 2.0–3.0)

## 2023-04-15 NOTE — Patient Instructions (Signed)
Description   Only take 1/2 tablet today and then continue warfarin 1 tablet daily except 2 tablets on Wednesdays.  Stay consistent with greens (2 times per week).  Recheck in 4 weeks.  Call us with medication changes #[phone].

## 2023-05-02 ENCOUNTER — Other Ambulatory Visit: Payer: Self-pay | Admitting: Interventional Cardiology

## 2023-05-02 DIAGNOSIS — I480 Paroxysmal atrial fibrillation: Secondary | ICD-10-CM

## 2023-05-03 ENCOUNTER — Encounter: Payer: Self-pay | Admitting: Podiatry

## 2023-05-03 ENCOUNTER — Ambulatory Visit (INDEPENDENT_AMBULATORY_CARE_PROVIDER_SITE_OTHER): Payer: 59 | Admitting: Podiatry

## 2023-05-03 DIAGNOSIS — I7300 Raynaud's syndrome without gangrene: Secondary | ICD-10-CM

## 2023-05-13 ENCOUNTER — Ambulatory Visit: Payer: 59 | Attending: Interventional Cardiology | Admitting: *Deleted

## 2023-05-13 DIAGNOSIS — Z7901 Long term (current) use of anticoagulants: Secondary | ICD-10-CM

## 2023-05-13 DIAGNOSIS — I059 Rheumatic mitral valve disease, unspecified: Secondary | ICD-10-CM

## 2023-05-13 LAB — POCT INR: INR: 1.5 — AB (ref 2.0–3.0)

## 2023-05-13 NOTE — Patient Instructions (Addendum)
 Description   Today and tomorrow take 1.5 tablets of warfarin then continue warfarin 1 tablet daily except 2 tablets on Wednesdays.  Stay consistent with greens (2 times per week).  Recheck in 2 weeks.  Call us with medication changes #[phone].

## 2023-05-27 ENCOUNTER — Ambulatory Visit: Payer: 59 | Attending: Internal Medicine

## 2023-05-27 DIAGNOSIS — Z7901 Long term (current) use of anticoagulants: Secondary | ICD-10-CM

## 2023-05-27 DIAGNOSIS — I059 Rheumatic mitral valve disease, unspecified: Secondary | ICD-10-CM | POA: Diagnosis not present

## 2023-05-27 LAB — POCT INR: INR: 3.6 — AB (ref 2.0–3.0)

## 2023-05-27 NOTE — Patient Instructions (Signed)
Description   HOLD today's dose and then continue warfarin 1 tablet daily except 2 tablets on Wednesdays.  Stay consistent with greens (2 times per week).  Recheck in 2 weeks.  Call us with medication changes #[phone].

## 2023-06-02 NOTE — Progress Notes (Signed)
Cardiology Office Note:  .   Date:  06/15/2023  ID:  Jasmine Kelley, DOB 10-07-1945, MRN [id number] PCP: Georgann Housekeeper, MD  [facility] Providers Cardiologist:  Lance Muss, MD    History of Present Illness: Marland Kitchen   Jasmine Kelley is a 78 y.o. 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 Echo 06/2016 LVEF 55-60% normal valve.  I saw 12/2021 for preop clearance and having DOE and tacycardia with exertion. Zio HR 37-132 avg HR 80 ventricular trigeminy.  Patient comes in with her daughter. Denies chest pain, dyspnea, palpitations. Has severe pain in R foot and some on left foot and was diagnosed with Raynaud's. Feet are curled and purple. ABI's normal 5/023   ROS:    Studies Reviewed: Marland Kitchen    EKG Interpretation Date/Time:  Tuesday June 15 2023 12:43:33 EST Ventricular Rate:  77 PR Interval:  172 QRS Duration:  74 QT Interval:  380 QTC Calculation: 430 R Axis:   54  Text Interpretation: Normal sinus rhythm Nonspecific ST and T wave abnormality When compared with ECG of 09-Feb-2022 23:59, No significant change was found Confirmed by Jacolyn Reedy [phone]) on 06/15/2023 12:51:49 PM    Prior CV Studies:    Zio 01/2022 Normal sinus rhythm with rare PACs and rare PVCs.  Occasional blocked PACs.   No atrial fibrillation.   No sustained arrhythmias.   No patient's symptoms.  No significant pauses.  Echo 12/2021 IMPRESSIONS     1. Left ventricular ejection fraction, by estimation, is 55 to 60%. The  left ventricle has normal function. The left ventricle has no regional  wall motion abnormalities. Left ventricular diastolic function could not  be evaluated. The average left  ventricular global longitudinal strain is -18.5 %. The global longitudinal  strain is normal.   2. Right ventricular systolic function is normal. The right ventricular  size is normal. There is normal pulmonary artery systolic  pressure.   3. Left atrial size was mildly dilated.   4. The mitral valve has been repaired/replaced. Trivial mitral valve  regurgitation. The mean mitral valve gradient is 3.0 mmHg. There is a [employer]  Jude mechanical valve present in the mitral position. Procedure Date:  2001. Echo findings are consistent with  normal structure and function of the mitral valve prosthesis.   5. The aortic valve is tricuspid. Aortic valve regurgitation is not  visualized. No aortic stenosis is present.   6. The inferior vena cava is normal in size with greater than 50%  respiratory variability, suggesting right atrial pressure of 3 mmHg.   Comparison(s): No significant change from prior study.    Risk Assessment/Calculations:    CHA2DS2-VASc Score = 7   This indicates a 11.2% annual risk of stroke. The patient's score is based upon: CHF History: 0 HTN History: 1 Diabetes History: 0 Stroke History: 2 Vascular Disease History: 1 Age Score: 2 Gender Score: 1            Physical Exam:   VS:  BP 134/78 (BP Location: Right Arm, Patient Position: Sitting, Cuff Size: Normal)   Pulse 64   Resp 16   Ht 5\' 8"  (1.727 m)   Wt 154 lb 6.4 oz (70 kg)   SpO2 92%   BMI 23.48 kg/m    Wt Readings from Last 3 Encounters:  06/15/23 154 lb 6.4 oz (70 kg)  12/10/21 140 lb (63.5 kg)  11/06/20 143 lb 6.4 oz (  65 kg)    GEN: Well nourished, well developed in no acute distress NECK: No JVD; No carotid bruits CARDIAC: RRR, no murmurs, rubs, gallops RESPIRATORY:  Clear to auscultation without rales, wheezing or rhonchi  ABDOMEN: Soft, non-tender, non-distended EXTREMITIES:  No edema; toes curled under, left lower ext good DP pulse, decreased on right, both feet cold  ASSESSMENT AND PLAN: .   Mechanical mitral valve on Coumadin-for INR check today SBE prophylaxis for dental work provided by PCP, no murmur   PAF-not on rate lowering drugs no further palpitations   HLD LDL 91 11/24 on zocor  PVD-severe bilateral  foot pain R>L with purple feet. Diagnosed with Raynaud's in the past. Will order LE arterial dopplers/ABI's and refer to vascular surgery for their opinion. Add amlodipine 5 mg 1/2 tab daily         Dispo: f/u in 1 yr  Signed, Jacolyn Reedy, PA-C

## 2023-06-10 ENCOUNTER — Ambulatory Visit: Payer: 59

## 2023-06-15 ENCOUNTER — Ambulatory Visit: Payer: 59 | Attending: Physician Assistant | Admitting: Physician Assistant

## 2023-06-15 ENCOUNTER — Ambulatory Visit (INDEPENDENT_AMBULATORY_CARE_PROVIDER_SITE_OTHER): Payer: 59 | Admitting: *Deleted

## 2023-06-15 VITALS — BP 134/78 | HR 64 | Resp 16 | Ht 68.0 in | Wt 154.4 lb

## 2023-06-15 DIAGNOSIS — I059 Rheumatic mitral valve disease, unspecified: Secondary | ICD-10-CM

## 2023-06-15 DIAGNOSIS — I7300 Raynaud's syndrome without gangrene: Secondary | ICD-10-CM | POA: Diagnosis not present

## 2023-06-15 DIAGNOSIS — I739 Peripheral vascular disease, unspecified: Secondary | ICD-10-CM

## 2023-06-15 DIAGNOSIS — Z7901 Long term (current) use of anticoagulants: Secondary | ICD-10-CM

## 2023-06-15 DIAGNOSIS — I480 Paroxysmal atrial fibrillation: Secondary | ICD-10-CM

## 2023-06-15 DIAGNOSIS — E785 Hyperlipidemia, unspecified: Secondary | ICD-10-CM

## 2023-06-15 LAB — POCT INR: INR: 2.9 (ref 2.0–3.0)

## 2023-06-15 MED ORDER — AMLODIPINE BESYLATE 5 MG PO TABS: 2.5000 mg | ORAL_TABLET | Freq: Every day | ORAL | 3 refills | Status: AC

## 2023-06-15 NOTE — Patient Instructions (Signed)
 Medication Instructions:  Your physician has recommended you make the following change in your medication:   1) START amlodipine 2.5 mg (half of a 5 mg tablet) daily  *If you need a refill on your cardiac medications before your next appointment, please call your pharmacy*  Testing/Procedures: Your physician has requested that you have a lower or upper extremity arterial duplex. This test is an ultrasound of the arteries in the legs or arms. It looks at arterial blood flow in the legs and arms. Allow one hour for Lower and Upper Arterial scans. There are no restrictions or special instructions.  Please note: We ask at that you not bring children with you during ultrasound (echo/ vascular) testing. Due to room size and safety concerns, children are not allowed in the ultrasound rooms during exams. Our front office staff cannot provide observation of children in our lobby area while testing is being conducted. An adult accompanying a patient to their appointment will only be allowed in the ultrasound room at the discretion of the ultrasound technician under special circumstances. We apologize for any inconvenience.   Your physician has requested that you have an ankle brachial index (ABI). During this test an ultrasound and blood pressure cuff are used to evaluate the arteries that supply the arms and legs with blood. Allow thirty minutes for this exam. There are no restrictions or special instructions.  Please note: We ask at that you not bring children with you during ultrasound (echo/ vascular) testing. Due to room size and safety concerns, children are not allowed in the ultrasound rooms during exams. Our front office staff cannot provide observation of children in our lobby area while testing is being conducted. An adult accompanying a patient to their appointment will only be allowed in the ultrasound room at the discretion of the ultrasound technician under special circumstances. We apologize for  any inconvenience.   Follow-Up: At [facility], you and your health needs are our priority.  As part of our continuing mission to provide you with exceptional heart care, we have created designated Provider Care Teams.  These Care Teams include your primary Cardiologist (physician) and Advanced Practice Providers (APPs -  Physician Assistants and Nurse Practitioners) who all work together to provide you with the care you need, when you need it.  Your next appointment:   1 year(s)  The format for your next appointment:   In Person  Provider:   Weston Brass, MD {  Other Instructions How to cut down on sugar in your diet Added sugars, such as table sugar, honey and syrups, should not make up more than 5% of the energy you get from food and drink each day. That's about 30g a day for anyone aged 76 and older.  Sugar's many guises There are lots of different ways added sugar can be listed on ingredients labels:  sucrose glucose fructose maltose fruit juice molasses hydrolysed starch invert sugar corn syrup honey Food labels tell you how much sugar a food contains:  high in sugar - 22.5g or more of total sugar per 100g low in sugar - 5g or less of total sugar per 100g Some packaging uses a colour-coded system that makes it easy to choose foods that are lower in sugar, salt and fat. Look for more "greens" and "ambers", and fewer "reds", in your shopping basket.  Breakfast Many breakfast cereals are high in sugar. Try switching to lower-sugar cereals or those with no added sugar, such as:  plain wheat biscuit cereal  plain shredded wholegrain cereal no-added-sugar muesli plain porridge wholemeal toast plain natural yoghurt topped with chopped fruit Porridge oats are cheap and contain vitamins, minerals and fibre. Make porridge with semi-skimmed, 1% or skimmed milk, or water.  If you usually add sugar to your porridge, try adding a few chopped dried apricots or a sliced or  mashed banana instead.  For a more gradual approach, you could eat sugary cereals and plain cereals on alternate days, or mix both in the same bowl.  If you add sugar to your cereal, you could try adding less. Or you could eat a smaller portion and add some chopped fruit, such as a pear or banana, which is an easy way of getting some of your 5 A Day.  If toast is your breakfast staple, try wholemeal or granary bread, which is higher in fibre than white bread, and see if you can get by with a little less of your usual spreads like jam, marmalade, honey or chocolate. Or you could try sugar-free or lower-sugar options.  You can find breakfast recipes on the [employer] website.  Main meals Many foods that we do not consider to be sweet contain a surprisingly large amount of sugar. Some ready-made soups, stir-in sauces and ready meals can also be higher in sugar than you think.  When eating out or buying takeaways, watch out for dishes that are typically high in sugar, such as sweet and sour dishes, sweet chilli dishes and some curry sauces, as well as salads with dressings like salad cream, which can also be high in sugar.  Condiments and sauces such as ketchup can have as much as 23g of sugar in 100g - roughly half a teaspoon per serving. These foods are usually served in small quantities, but the sugar count can add up if eaten every day.  Snacks Healthier snack options are those without added sugar, such as fresh or tinned fruit (in juice, not syrup), unsalted mixed nuts, plain popcorn, rice cakes, crackers topped with lower-fat cheese or lower-sugar yoghurts.  If you are not ready to give up your favourite flavours, you could start by having less. Instead of 2 biscuits in 1 sitting, try having 1. If your snack has 2 bars, have 1 and share the other, or save it for another day.  If you're an "all-or-nothing" type person, you could find something to do to take your mind off food on some days  of the week.  When shopping, look out for lower-sugar (and lower-fat) versions of your favourite snacks. Buy smaller packs, or skip the family bags and just go for the normal-sized one instead.  Here are some lower-calorie substitutes for popular snacks:  cereal bars - despite their healthy image, many cereal bars can be high in sugar and fat. Look out for bars that are lower in sugar, fat and salt. chocolate - swap for a lower-calorie hot instant chocolate drink. You can also get chocolate with coffee and chocolate with malt varieties. biscuits - swap for oatcakes, oat biscuits, or unsalted rice cakes, which also provide fibre. cakes - swap for a plain currant bun, fruit scone, or malt loaf. If you add toppings or spreads, use them sparingly or choose lower-fat and lower-sugar varieties. Dried fruit, such as raisins, dates and apricots, is high in sugar and can be bad for your dental health because it sticks to your teeth.  To prevent tooth decay, dried fruit is best enjoyed at mealtimes - as part of a dessert,  for example - rather than as a snack.  Drinks Nearly a quarter of the added sugar in our diets comes from sugary drinks, such as fizzy drinks, sweetened juices, milkshakes and cordials.  A can of regular cola contains 7 teaspoons of sugar (35g). Try swapping to water, sugar-free or no-added-sugar drinks or lower-fat milks.  If you take sugar in tea or coffee, gradually reduce the amount until you can cut it out altogether, or try swapping to sweeteners instead. Try some new flavours with herbal teas, or make your own with hot water and a slice of lemon or ginger.  Like some fizzy drinks, fruit juice can be high in sugar. When juice is extracted from the whole fruit to make fruit juice, sugar is released, and this can damage your teeth.  Your combined total of drinks from fruit juice, vegetable juice and smoothies should not be more than a day - which is a small glass. For  example, if you have of orange juice and smoothie in one day, you'll have exceeded the recommendation by .  Fruit juices and smoothies do contain vitamins and minerals and can count towards your 5 A Day. However they can only ever count as a maximum of 1 portion of your 5 A Day. For example, if you have 2 glasses of fruit juice and a smoothie in 1 day, that still only counts as 1 portion.  You could try flavouring water with a slice of lemon, lime, or a splash of fruit juice. But watch out for the sugar content in squash or cordials with added sugar. Some can contain up to 3 teaspoons of sugar in each glass.  Dessert Work out some Tax adviser. Do you need to have dessert every day? How about only having dessert after your evening meal, or only eating dessert on odd days of the month, or only on weekends, or only at restaurants?  Do you have to have chocolate, biscuits, and cake every day? If you had this type of sugary snack less often, would you actually enjoy it more?  Less sugary desserts include fruit - fresh, frozen, dried, or tinned, but choose those canned in juice rather than syrup - as well as lower-fat and lower-sugar rice pudding, and plain lower-fat yoghurt.  However, lower fat does not necessarily mean low sugar. Some lower-fat yoghurts can be sweetened with refined sugar, fruit juice concentrate, glucose, and fructose syrup.  If you're stuck between choosing 2 desserts at the supermarket, why not compare the labels on both packages and go for the one with the lower amount of sugar.

## 2023-06-15 NOTE — Patient Instructions (Signed)
Description   Continue warfarin 1 tablet daily except 2 tablets on Wednesdays.  Stay consistent with greens (2 times per week).  Recheck in 4 weeks.  Call us with medication changes #[phone].

## 2023-07-13 ENCOUNTER — Ambulatory Visit (INDEPENDENT_AMBULATORY_CARE_PROVIDER_SITE_OTHER): Payer: 59 | Admitting: *Deleted

## 2023-07-13 ENCOUNTER — Ambulatory Visit (HOSPITAL_BASED_OUTPATIENT_CLINIC_OR_DEPARTMENT_OTHER)
Admission: RE | Admit: 2023-07-13 | Discharge: 2023-07-13 | Disposition: A | Discharge status: Home / Self Care | Payer: 59 | Source: Ambulatory Visit | Attending: Physician Assistant | Admitting: Physician Assistant

## 2023-07-13 ENCOUNTER — Ambulatory Visit (HOSPITAL_COMMUNITY)
Admission: RE | Admit: 2023-07-13 | Discharge: 2023-07-13 | Disposition: A | Discharge status: Home / Self Care | Payer: 59 | Source: Ambulatory Visit | Attending: Cardiovascular Disease | Admitting: Cardiovascular Disease

## 2023-07-13 DIAGNOSIS — I059 Rheumatic mitral valve disease, unspecified: Secondary | ICD-10-CM

## 2023-07-13 DIAGNOSIS — I7300 Raynaud's syndrome without gangrene: Secondary | ICD-10-CM | POA: Insufficient documentation

## 2023-07-13 DIAGNOSIS — Z7901 Long term (current) use of anticoagulants: Secondary | ICD-10-CM | POA: Insufficient documentation

## 2023-07-13 DIAGNOSIS — I739 Peripheral vascular disease, unspecified: Secondary | ICD-10-CM | POA: Insufficient documentation

## 2023-07-13 LAB — POCT INR: INR: 3.8 — AB (ref 2.0–3.0)

## 2023-07-13 NOTE — Patient Instructions (Addendum)
 Description   Do not take any warfarin today then START taking warfarin 1 tablet daily. Stay consistent with greens (2 times per week).  Recheck in 3 weeks.  Call us with medication changes #[phone].

## 2023-07-14 ENCOUNTER — Ambulatory Visit (INDEPENDENT_AMBULATORY_CARE_PROVIDER_SITE_OTHER): Payer: 59 | Admitting: Vascular Surgery

## 2023-07-14 ENCOUNTER — Encounter: Payer: Self-pay | Admitting: Vascular Surgery

## 2023-07-14 VITALS — BP 132/78 | HR 82 | Temp 97.8°F | Ht 68.0 in | Wt 151.0 lb

## 2023-07-14 DIAGNOSIS — I7389 Other specified peripheral vascular diseases: Secondary | ICD-10-CM | POA: Diagnosis not present

## 2023-07-14 LAB — VAS US ABI WITH/WO TBI
Left ABI: 1.04
Right ABI: 1.15

## 2023-07-14 NOTE — Progress Notes (Signed)
 Patient ID: Jasmine Kelley, female   DOB: 07-11-1945, 78 y.o.   MRN: [id number]  Reason for Consult: New Patient (Initial Visit)   Referred by Georgann Housekeeper, MD  Subjective:     HPI:  Jasmine Kelley is a 78 y.o. female without significant history of vascular disease.  She has significant right greater than left foot pain.  She has a long history of purple discoloration of the right foot and has been evaluated here in 2010 and.  She does not have any tissue loss or ulceration of her feet.  More recently her left foot has also become discolored and painful.  This is associated with numbness.  Patient remains active doing gardening and usually only wears flip-flops and never wears socks and cannot tolerate compression stockings.  Past Medical History:  Diagnosis Date   A-fib [facility])    Allergic rhinitis    Anxiety    Bell's palsy    Cerebellar stroke (HCC)    11/11/09   Chronic anticoagulation    coumadin, AFIB   Dermatitis    Dizziness    chronic, gait disorder, after stroke   GI bleed    Hypothyroidism    Left shoulder pain    Major depression    Osteoporosis    BMD 2013   Pulmonary HTN (HCC)    PVD (peripheral vascular disease) (HCC)    Rectus sheath hematoma    2005   S/P MVR (mitral valve replacement)    a. mitral stenosis s/p mechanical MVR 2001.   Family History  Problem Relation Age of Onset   Breast cancer Mother    CVA Father    Diabetes Father    Heart attack Father    Stroke Father    Hypertension Neg Hx    History reviewed. No pertinent surgical history.  Short Social History:  Social History   Tobacco Use   Smoking status: Never   Smokeless tobacco: Never  Substance Use Topics   Alcohol use: Yes    Allergies  Allergen Reactions   Bee Venom Shortness Of Breath and Swelling   Benadryl [Diphenhydramine Hcl (Sleep)]     jittery   Buspirone Nausea Only    MAKES HER SICK MAKES HER SICK   Chlorpromazine Hcl    Chlorpromazine Hcl Other (See  Comments)   Diphenhydramine Hcl Other (See Comments)   Mirtazapine     Other reaction(s): intol   Other Other (See Comments)    jittery   Peanut Butter Flavoring Agent (Non-Screening) Nausea Only    dizziness    Current Outpatient Medications  Medication Sig Dispense Refill   alendronate (FOSAMAX) 70 MG tablet Take 70 mg by mouth once a week.  5   amLODipine (NORVASC) 5 MG tablet Take 0.5 tablets (2.5 mg total) by mouth daily. 45 tablet 3   azelastine (ASTELIN) 0.1 % nasal spray 1 puff in each nostril     DULoxetine (CYMBALTA) 60 MG capsule Take 1 tablet by mouth daily.     ferrous fumarate (HEMOCYTE - 106 MG FE) 325 (106 FE) MG TABS tablet Take 1 tablet by mouth daily.     gabapentin (NEURONTIN) 100 MG capsule Take 100-200 mg by mouth at bedtime.     simvastatin (ZOCOR) 10 MG tablet every evening.     SYNTHROID 100 MCG tablet      valACYclovir (VALTREX) 500 MG tablet Take 500 mg by mouth daily as needed (blisters).      valsartan (DIOVAN) 80 MG tablet  1 tablet     Vitamin D, Ergocalciferol, 50000 units CAPS 1 capsule     warfarin (COUMADIN) 2 MG tablet TAKE 1 TO 2 TABLETS BY MOUTH DAILY AS DIRECTED BY THE COUMADIN CLINIC 100 tablet 0   LORazepam (ATIVAN) 0.5 MG tablet 1/2-1  tablet as needed (Patient not taking: Reported on 07/14/2023)     LORazepam (ATIVAN) 1 MG tablet Take 0.5 tablets (0.5 mg total) by mouth every 6 (six) hours as needed for anxiety. (Patient not taking: Reported on 07/14/2023) 15 tablet 0   No current facility-administered medications for this visit.    Review of Systems  Constitutional:  Constitutional negative. HENT: HENT negative.  Eyes: Eyes negative.  Respiratory: Respiratory negative.  Cardiovascular: Positive for leg swelling.  GI: Gastrointestinal negative.  Musculoskeletal:       Purple feet Painful feet Skin: Skin negative.  Neurological: Positive for numbness.  Hematologic: Hematologic/lymphatic negative.  Psychiatric: Psychiatric negative.         Objective:  Objective   Vitals:   07/14/23 1518  BP: 132/78  Pulse: 82  Temp: 97.8 F (36.6 C)  SpO2: 98%  Weight: 151 lb (68.5 kg)  Height: 5\' 8"  (1.727 m)   Body mass index is 22.96 kg/m.  Physical Exam HENT:     Head: Normocephalic.     Nose: Nose normal.  Eyes:     Pupils: Pupils are equal, round, and reactive to light.  Cardiovascular:     Rate and Rhythm: Normal rate.  Pulmonary:     Effort: Pulmonary effort is normal.  Abdominal:     General: Abdomen is flat.  Musculoskeletal:        General: Normal range of motion.     Cervical back: Normal range of motion and neck supple.     Comments: Swelling and purple discoloration of both feet  Skin:    General: Skin is warm.     Capillary Refill: Capillary refill takes less than 2 seconds.  Neurological:     Mental Status: She is alert.  Psychiatric:        Mood and Affect: Mood normal.     Data: +-----------+--------+-----+--------+---------+--------+  RIGHT     PSV cm/sRatioStenosisWaveform Comments  +-----------+--------+-----+--------+---------+--------+  CFA Prox   106                  triphasic          +-----------+--------+-----+--------+---------+--------+  SFA Prox   139                  triphasic          +-----------+--------+-----+--------+---------+--------+  SFA Mid    101                  triphasic          +-----------+--------+-----+--------+---------+--------+  SFA Distal 102                  triphasic          +-----------+--------+-----+--------+---------+--------+  POP Prox   84                   biphasic           +-----------+--------+-----+--------+---------+--------+  POP Mid    87                   biphasic           +-----------+--------+-----+--------+---------+--------+  POP Distal 79  biphasic           +-----------+--------+-----+--------+---------+--------+  TP Trunk   82                    biphasic           +-----------+--------+-----+--------+---------+--------+  ATA Prox   58                   biphasic           +-----------+--------+-----+--------+---------+--------+  ATA Mid    88                   biphasic           +-----------+--------+-----+--------+---------+--------+  ATA Distal 96                   biphasic           +-----------+--------+-----+--------+---------+--------+  PTA Prox   75                   biphasic           +-----------+--------+-----+--------+---------+--------+  PTA Mid    136                  biphasic           +-----------+--------+-----+--------+---------+--------+  PTA Distal 99                   biphasic           +-----------+--------+-----+--------+---------+--------+  PERO Prox  73                   biphasic           +-----------+--------+-----+--------+---------+--------+  PERO Mid   63                   biphasic           +-----------+--------+-----+--------+---------+--------+  PERO Distal50                   biphasic           +-----------+--------+-----+--------+---------+--------+      +-----------+--------+-----+--------+---------+--------+  LEFT      PSV cm/sRatioStenosisWaveform Comments  +-----------+--------+-----+--------+---------+--------+  CFA Prox   147                  triphasic          +-----------+--------+-----+--------+---------+--------+  SFA Prox   113                  triphasic          +-----------+--------+-----+--------+---------+--------+  SFA Mid    106                  triphasic          +-----------+--------+-----+--------+---------+--------+  SFA Distal 98                   triphasic          +-----------+--------+-----+--------+---------+--------+  POP Prox   86                   triphasic          +-----------+--------+-----+--------+---------+--------+  POP Mid    74                    triphasic          +-----------+--------+-----+--------+---------+--------+  POP Distal 67  triphasic          +-----------+--------+-----+--------+---------+--------+  TP Trunk   79                   biphasic           +-----------+--------+-----+--------+---------+--------+  ATA Prox   65                   biphasic           +-----------+--------+-----+--------+---------+--------+  ATA Mid    64                   biphasic           +-----------+--------+-----+--------+---------+--------+  ATA Distal 42                   biphasic           +-----------+--------+-----+--------+---------+--------+  PTA Prox   79                   biphasic           +-----------+--------+-----+--------+---------+--------+  PTA Mid    105                  biphasic           +-----------+--------+-----+--------+---------+--------+  PTA Distal 68                   biphasic           +-----------+--------+-----+--------+---------+--------+  PERO Prox  59                   biphasic           +-----------+--------+-----+--------+---------+--------+  PERO Mid   41                   biphasic           +-----------+--------+-----+--------+---------+--------+  PERO Distal38                   biphasic           +-----------+--------+-----+--------+---------+--------+        Summary:  Right: Mild atherosclerosis throughout without evidence of stenosis from  the common femoral artery throughout the tibioperoneal trunk.  Three vessel run-off.   Left: Mild atherosclerosis throughout without evidence of stenosis from  the common femoral artery throughout the tibioperoneal trunk.  Three vessel run-off.   ABI Findings:  +---------+------------------+-----+-----------+--------+  Right   Rt Pressure (mmHg)IndexWaveform   Comment   +---------+------------------+-----+-----------+--------+  Brachial 148                                          +---------+------------------+-----+-----------+--------+  PTA     170               1.15 multiphasic          +---------+------------------+-----+-----------+--------+  DP      143               0.97 multiphasic          +---------+------------------+-----+-----------+--------+  Great Toe98                0.66 Normal               +---------+------------------+-----+-----------+--------+   +---------+------------------+-----+-----------+-------+  Left    Lt Pressure (mmHg)IndexWaveform   Comment  +---------+------------------+-----+-----------+-------+  Brachial 144                                        +---------+------------------+-----+-----------+-------+  PTA     154               1.04 multiphasic         +---------+------------------+-----+-----------+-------+  DP      151               1.02 multiphasic         +---------+------------------+-----+-----------+-------+  Great Toe92                0.62 Normal              +---------+------------------+-----+-----------+-------+   +-------+-----------+-----------+------------+------------+  ABI/TBIToday's ABIToday's TBIPrevious ABIPrevious TBI  +-------+-----------+-----------+------------+------------+  Right 1.15       .66        1.16        .82           +-------+-----------+-----------+------------+------------+  Left  1.04       .62        1.22        .90           +-------+-----------+-----------+------------+------------+         Bilateral ABIs appear essentially unchanged compared to prior study on  09/08/2021. Bilateral TBIs appear decreased compared to prior study on  05/08/223.    Summary:  Right: Resting right ankle-brachial index is within normal range. The  right toe-brachial index is abnormal.   Left: Resting left ankle-brachial index is within normal range. The left  toe-brachial index is abnormal.            Assessment/Plan:     78 year old female with purple feet that is associated with pain and numbness.  She does not appear to have any arterial insufficiency may have blood probably due to venous insufficiency although no evidence of skin changes.  I recommended elevation of her feet when recumbent as well as wearing compression stockings although this is unlikely given that she cannot tolerate socks.  She does need to protect her feet and can see me on an as-needed basis.     Maeola Harman MD [facility]

## 2023-08-03 ENCOUNTER — Ambulatory Visit

## 2023-08-05 ENCOUNTER — Ambulatory Visit: Attending: Interventional Cardiology

## 2023-08-05 DIAGNOSIS — Z7901 Long term (current) use of anticoagulants: Secondary | ICD-10-CM | POA: Diagnosis not present

## 2023-08-05 DIAGNOSIS — I059 Rheumatic mitral valve disease, unspecified: Secondary | ICD-10-CM | POA: Diagnosis not present

## 2023-08-05 LAB — POCT INR: INR: 2.2 (ref 2.0–3.0)

## 2023-08-05 NOTE — Patient Instructions (Addendum)
 Description   Take 1.5 tablets today and then taking warfarin 1 tablet daily.  Stay consistent with greens (2 times per week).  Recheck in 2 weeks.  Call us with medication changes #[phone].

## 2023-08-06 ENCOUNTER — Other Ambulatory Visit (HOSPITAL_COMMUNITY): Payer: Self-pay

## 2023-08-06 ENCOUNTER — Other Ambulatory Visit: Payer: Self-pay

## 2023-08-06 DIAGNOSIS — I480 Paroxysmal atrial fibrillation: Secondary | ICD-10-CM

## 2023-08-06 MED ORDER — WARFARIN SODIUM 2 MG PO TABS
ORAL_TABLET | ORAL | 0 refills | Status: DC
  Filled 2023-08-06: qty 100, 50d supply, fill #0

## 2023-08-09 ENCOUNTER — Other Ambulatory Visit (HOSPITAL_COMMUNITY): Payer: Self-pay

## 2023-08-19 ENCOUNTER — Ambulatory Visit

## 2023-08-19 ENCOUNTER — Other Ambulatory Visit (HOSPITAL_COMMUNITY): Payer: Self-pay

## 2023-09-03 ENCOUNTER — Ambulatory Visit

## 2023-09-06 DIAGNOSIS — E039 Hypothyroidism, unspecified: Secondary | ICD-10-CM | POA: Diagnosis not present

## 2023-09-06 DIAGNOSIS — I10 Essential (primary) hypertension: Secondary | ICD-10-CM | POA: Diagnosis not present

## 2023-09-06 DIAGNOSIS — M810 Age-related osteoporosis without current pathological fracture: Secondary | ICD-10-CM | POA: Diagnosis not present

## 2023-09-06 DIAGNOSIS — D6869 Other thrombophilia: Secondary | ICD-10-CM | POA: Diagnosis not present

## 2023-09-06 DIAGNOSIS — I270 Primary pulmonary hypertension: Secondary | ICD-10-CM | POA: Diagnosis not present

## 2023-09-06 DIAGNOSIS — G629 Polyneuropathy, unspecified: Secondary | ICD-10-CM | POA: Diagnosis not present

## 2023-09-06 DIAGNOSIS — E782 Mixed hyperlipidemia: Secondary | ICD-10-CM | POA: Diagnosis not present

## 2023-09-06 DIAGNOSIS — Z8673 Personal history of transient ischemic attack (TIA), and cerebral infarction without residual deficits: Secondary | ICD-10-CM | POA: Diagnosis not present

## 2023-09-06 DIAGNOSIS — I4821 Permanent atrial fibrillation: Secondary | ICD-10-CM | POA: Diagnosis not present

## 2023-09-09 ENCOUNTER — Other Ambulatory Visit: Payer: Self-pay | Admitting: Physician Assistant

## 2023-09-09 DIAGNOSIS — I480 Paroxysmal atrial fibrillation: Secondary | ICD-10-CM

## 2023-09-09 NOTE — Telephone Encounter (Signed)
 Warfarin 2mg  refill Mitral valve disorder  Last INR 08/05/23 & OVERDUE, pending appt on 09/10/23 Last OV 06/15/23

## 2023-09-10 ENCOUNTER — Ambulatory Visit

## 2023-09-16 ENCOUNTER — Ambulatory Visit: Attending: Interventional Cardiology | Admitting: *Deleted

## 2023-09-16 DIAGNOSIS — Z5181 Encounter for therapeutic drug level monitoring: Secondary | ICD-10-CM | POA: Diagnosis not present

## 2023-09-16 DIAGNOSIS — I059 Rheumatic mitral valve disease, unspecified: Secondary | ICD-10-CM

## 2023-09-16 DIAGNOSIS — Z7901 Long term (current) use of anticoagulants: Secondary | ICD-10-CM

## 2023-09-16 LAB — POCT INR: POC INR: 2.8

## 2023-09-16 NOTE — Patient Instructions (Signed)
 Description   Continue taking warfarin 1 tablet daily.  Stay consistent with greens (2 times per week).  Recheck in 4 weeks.  Call us  with medication changes [phone].

## 2023-10-07 ENCOUNTER — Other Ambulatory Visit: Payer: Self-pay | Admitting: Physician Assistant

## 2023-10-07 DIAGNOSIS — I480 Paroxysmal atrial fibrillation: Secondary | ICD-10-CM

## 2023-10-14 ENCOUNTER — Ambulatory Visit

## 2023-10-18 ENCOUNTER — Ambulatory Visit: Attending: Interventional Cardiology

## 2023-10-18 DIAGNOSIS — Z7901 Long term (current) use of anticoagulants: Secondary | ICD-10-CM | POA: Diagnosis not present

## 2023-10-18 DIAGNOSIS — I480 Paroxysmal atrial fibrillation: Secondary | ICD-10-CM

## 2023-10-18 DIAGNOSIS — I059 Rheumatic mitral valve disease, unspecified: Secondary | ICD-10-CM

## 2023-10-18 LAB — POCT INR: INR: 2.6 (ref 2.0–3.0)

## 2023-10-18 MED ORDER — WARFARIN SODIUM 2 MG PO TABS: ORAL_TABLET | ORAL | 0 refills | Status: DC

## 2023-10-18 NOTE — Patient Instructions (Signed)
 Continue taking warfarin 1 tablet daily.  Stay consistent with greens (2 times per week).  Recheck in 4 weeks.  Call us  with medication changes [phone].

## 2023-11-14 ENCOUNTER — Other Ambulatory Visit: Payer: Self-pay | Admitting: Physician Assistant

## 2023-11-14 DIAGNOSIS — I480 Paroxysmal atrial fibrillation: Secondary | ICD-10-CM

## 2023-11-18 ENCOUNTER — Ambulatory Visit: Attending: Interventional Cardiology | Admitting: *Deleted

## 2023-11-18 DIAGNOSIS — Z5181 Encounter for therapeutic drug level monitoring: Secondary | ICD-10-CM | POA: Diagnosis not present

## 2023-11-18 DIAGNOSIS — I059 Rheumatic mitral valve disease, unspecified: Secondary | ICD-10-CM

## 2023-11-18 DIAGNOSIS — Z7901 Long term (current) use of anticoagulants: Secondary | ICD-10-CM | POA: Diagnosis not present

## 2023-11-18 DIAGNOSIS — I349 Nonrheumatic mitral valve disorder, unspecified: Secondary | ICD-10-CM | POA: Diagnosis not present

## 2023-11-18 LAB — POCT INR: POC INR: 2.8

## 2023-11-18 NOTE — Patient Instructions (Signed)
 Description   Continue taking warfarin 1 tablet daily.  Stay consistent with greens (2 times per week).  Recheck in 5 weeks.  Call us  with medication changes [phone].

## 2023-11-18 NOTE — Progress Notes (Signed)
Please see anticoagulation encounter.

## 2023-12-23 ENCOUNTER — Ambulatory Visit

## 2024-01-04 ENCOUNTER — Ambulatory Visit

## 2024-01-10 ENCOUNTER — Ambulatory Visit: Attending: Interventional Cardiology

## 2024-01-10 DIAGNOSIS — I349 Nonrheumatic mitral valve disorder, unspecified: Secondary | ICD-10-CM | POA: Diagnosis not present

## 2024-01-10 DIAGNOSIS — Z7901 Long term (current) use of anticoagulants: Secondary | ICD-10-CM

## 2024-01-10 DIAGNOSIS — I059 Rheumatic mitral valve disease, unspecified: Secondary | ICD-10-CM

## 2024-01-10 LAB — POCT INR: INR: 1.9 — AB (ref 2.0–3.0)

## 2024-01-10 NOTE — Patient Instructions (Signed)
 Take 1.5 tablets tonight only Continue taking warfarin 1 tablet daily.  Stay consistent with greens (2 times per week).  Recheck in 3 weeks.  Call us  with medication changes [phone].

## 2024-01-10 NOTE — Progress Notes (Signed)
 INR 1.9 Please see anticoagulation encounter Take 1.5 tablets tonight only Continue taking warfarin 1 tablet daily.  Stay consistent with greens (2 times per week).  Recheck in 3 weeks.  Call us  with medication changes [phone].

## 2024-01-31 ENCOUNTER — Other Ambulatory Visit: Payer: Self-pay

## 2024-01-31 ENCOUNTER — Ambulatory Visit: Attending: Interventional Cardiology | Admitting: *Deleted

## 2024-01-31 DIAGNOSIS — Z7901 Long term (current) use of anticoagulants: Secondary | ICD-10-CM | POA: Diagnosis not present

## 2024-01-31 DIAGNOSIS — I059 Rheumatic mitral valve disease, unspecified: Secondary | ICD-10-CM | POA: Diagnosis not present

## 2024-01-31 LAB — POCT INR: INR: 2.1 (ref 2.0–3.0)

## 2024-01-31 NOTE — Patient Instructions (Addendum)
 Description   INR-2.1; Today take 1.5 tablets of warfarin then START taking warfarin 1 tablet daily except 1.5 tablets on Mondays.  Stay consistent with greens (2 times per week).  Recheck in 3 weeks.  Call us  with medication changes [phone].

## 2024-01-31 NOTE — Progress Notes (Signed)
 Description   INR-2.1; Today take 1.5 tablets of warfarin then START taking warfarin 1 tablet daily except 1.5 tablets on Mondays.  Stay consistent with greens (2 times per week).  Recheck in 3 weeks.  Call us  with medication changes [phone].

## 2024-02-11 ENCOUNTER — Other Ambulatory Visit: Payer: Self-pay | Admitting: Physician Assistant

## 2024-02-11 DIAGNOSIS — I480 Paroxysmal atrial fibrillation: Secondary | ICD-10-CM

## 2024-02-11 NOTE — Telephone Encounter (Signed)
 Warfarin 2mg  refill Mitral valve disorder, Heart valve replaced by other means  Last INR 01/31/24 Last OV 06/15/23

## 2024-02-24 ENCOUNTER — Ambulatory Visit: Attending: Interventional Cardiology

## 2024-02-24 DIAGNOSIS — I349 Nonrheumatic mitral valve disorder, unspecified: Secondary | ICD-10-CM

## 2024-02-24 DIAGNOSIS — Z7901 Long term (current) use of anticoagulants: Secondary | ICD-10-CM | POA: Diagnosis not present

## 2024-02-24 DIAGNOSIS — I059 Rheumatic mitral valve disease, unspecified: Secondary | ICD-10-CM

## 2024-02-24 LAB — POCT INR: INR: 1.8 — AB (ref 2.0–3.0)

## 2024-02-24 NOTE — Patient Instructions (Signed)
 Take 2 tablets today only then continue taking warfarin 1 tablet daily except 1.5 tablets on Mondays.  Stay consistent with greens (2 times per week).  Recheck in 3 weeks.  Call us  with medication changes [phone].

## 2024-02-24 NOTE — Progress Notes (Signed)
 INR 1.8 Please see anticoagulation encounter Take 2 tablets today only then continue taking warfarin 1 tablet daily except 1.5 tablets on Mondays.  Stay consistent with greens (2 times per week).  Recheck in 3 weeks.  Call us  with medication changes [phone].

## 2024-03-14 ENCOUNTER — Ambulatory Visit: Attending: Interventional Cardiology | Admitting: Pharmacist

## 2024-03-14 DIAGNOSIS — I059 Rheumatic mitral valve disease, unspecified: Secondary | ICD-10-CM

## 2024-03-14 DIAGNOSIS — Z7901 Long term (current) use of anticoagulants: Secondary | ICD-10-CM

## 2024-03-14 DIAGNOSIS — I349 Nonrheumatic mitral valve disorder, unspecified: Secondary | ICD-10-CM

## 2024-03-14 DIAGNOSIS — I480 Paroxysmal atrial fibrillation: Secondary | ICD-10-CM | POA: Diagnosis not present

## 2024-03-14 LAB — POCT INR: INR: 2.1 (ref 2.0–3.0)

## 2024-03-14 NOTE — Progress Notes (Signed)
 Description   INR 2.1. Take 1.5 tablets today and then increase to 1.5 tablets on Monday, Wednesday, and Friday, 1 tablet all other days of the week   Stay consistent with greens (2 times per week).  Recheck in 3 weeks.  Call us  with medication changes [phone].

## 2024-03-14 NOTE — Patient Instructions (Signed)
 Description   INR 2.1. Take 1.5 tablets today and then increase to 1.5 tablets on Monday, Wednesday, and Friday, 1 tablet all other days of the week   Stay consistent with greens (2 times per week).  Recheck in 3 weeks.  Call us  with medication changes [phone].

## 2024-03-15 ENCOUNTER — Other Ambulatory Visit: Payer: Self-pay | Admitting: Internal Medicine

## 2024-03-15 DIAGNOSIS — R0609 Other forms of dyspnea: Secondary | ICD-10-CM

## 2024-03-16 ENCOUNTER — Ambulatory Visit
Admission: RE | Admit: 2024-03-16 | Discharge: 2024-03-16 | Disposition: A | Source: Ambulatory Visit | Attending: Internal Medicine | Admitting: Internal Medicine

## 2024-03-16 DIAGNOSIS — R0609 Other forms of dyspnea: Secondary | ICD-10-CM

## 2024-03-16 MED ORDER — IOPAMIDOL (ISOVUE-370) INJECTION 76%
75.0000 mL | Freq: Once | INTRAVENOUS | Status: AC | PRN
  Administered 2024-03-16: 75 mL via INTRAVENOUS

## 2024-03-28 ENCOUNTER — Ambulatory Visit: Attending: Interventional Cardiology

## 2024-03-28 DIAGNOSIS — I480 Paroxysmal atrial fibrillation: Secondary | ICD-10-CM

## 2024-03-28 DIAGNOSIS — I349 Nonrheumatic mitral valve disorder, unspecified: Secondary | ICD-10-CM | POA: Diagnosis not present

## 2024-03-28 DIAGNOSIS — Z7901 Long term (current) use of anticoagulants: Secondary | ICD-10-CM

## 2024-03-28 DIAGNOSIS — I059 Rheumatic mitral valve disease, unspecified: Secondary | ICD-10-CM

## 2024-03-28 LAB — POCT INR: INR: 2.8 (ref 2.0–3.0)

## 2024-03-28 NOTE — Patient Instructions (Signed)
 Description   INR 2.8, Continue on same dosage 1 tablet daily except 1.5 tablets on Mondays, Wednesdays, and Fridays.   Stay consistent with greens (2 times per week).  Recheck in 4 weeks.  Call us  with medication changes [phone].

## 2024-03-28 NOTE — Progress Notes (Signed)
 Description   INR 2.8, Continue on same dosage 1 tablet daily except 1.5 tablets on Mondays, Wednesdays, and Fridays.   Stay consistent with greens (2 times per week).  Recheck in 4 weeks.  Call us  with medication changes [phone].

## 2024-05-02 ENCOUNTER — Ambulatory Visit

## 2024-06-05 ENCOUNTER — Emergency Department (HOSPITAL_BASED_OUTPATIENT_CLINIC_OR_DEPARTMENT_OTHER): Admitting: Radiology

## 2024-06-05 ENCOUNTER — Emergency Department (HOSPITAL_BASED_OUTPATIENT_CLINIC_OR_DEPARTMENT_OTHER)
Admission: EM | Admit: 2024-06-05 | Discharge: 2024-06-05 | Disposition: A | Discharge status: Home / Self Care | Attending: Emergency Medicine | Admitting: Emergency Medicine

## 2024-06-05 ENCOUNTER — Other Ambulatory Visit: Payer: Self-pay

## 2024-06-05 DIAGNOSIS — Z7901 Long term (current) use of anticoagulants: Secondary | ICD-10-CM | POA: Insufficient documentation

## 2024-06-05 DIAGNOSIS — N3001 Acute cystitis with hematuria: Secondary | ICD-10-CM | POA: Insufficient documentation

## 2024-06-05 DIAGNOSIS — R0602 Shortness of breath: Secondary | ICD-10-CM | POA: Insufficient documentation

## 2024-06-05 DIAGNOSIS — Z79899 Other long term (current) drug therapy: Secondary | ICD-10-CM | POA: Insufficient documentation

## 2024-06-05 DIAGNOSIS — E039 Hypothyroidism, unspecified: Secondary | ICD-10-CM | POA: Insufficient documentation

## 2024-06-05 DIAGNOSIS — R0600 Dyspnea, unspecified: Secondary | ICD-10-CM | POA: Insufficient documentation

## 2024-06-05 DIAGNOSIS — Z91010 Allergy to peanuts: Secondary | ICD-10-CM | POA: Insufficient documentation

## 2024-06-05 LAB — URINALYSIS, ROUTINE W REFLEX MICROSCOPIC
Bilirubin Urine: NEGATIVE
Glucose, UA: NEGATIVE mg/dL
Ketones, ur: NEGATIVE mg/dL
Nitrite: POSITIVE — AB
Protein, ur: 100 mg/dL — AB
Specific Gravity, Urine: 1.019 (ref 1.005–1.030)
WBC, UA: >50 WBC/hpf (ref 0–5)
pH: 6 (ref 5.0–8.0)

## 2024-06-05 LAB — COMPREHENSIVE METABOLIC PANEL WITH GFR
ALT: 55 U/L — ABNORMAL HIGH (ref 0–44)
AST: 58 U/L — ABNORMAL HIGH (ref 15–41)
Albumin: 4.5 g/dL (ref 3.5–5.0)
Alkaline Phosphatase: 120 U/L (ref 38–126)
Anion gap: 13 (ref 5–15)
BUN: 16 mg/dL (ref 8–23)
CO2: 25 mmol/L (ref 22–32)
Calcium: 9.9 mg/dL (ref 8.9–10.3)
Chloride: 100 mmol/L (ref 98–111)
Creatinine, Ser: 0.77 mg/dL (ref 0.44–1.00)
GFR, Estimated: >60 mL/min
Glucose, Bld: 94 mg/dL (ref 70–99)
Potassium: 4 mmol/L (ref 3.5–5.1)
Sodium: 138 mmol/L (ref 135–145)
Total Bilirubin: 0.8 mg/dL (ref 0.0–1.2)
Total Protein: 8 g/dL (ref 6.5–8.1)

## 2024-06-05 LAB — PROTIME-INR
INR: 2.1 — ABNORMAL HIGH (ref 0.8–1.2)
Prothrombin Time: 24.6 s — ABNORMAL HIGH (ref 11.4–15.2)

## 2024-06-05 LAB — CBC
HCT: 40.4 % (ref 36.0–46.0)
Hemoglobin: 14 g/dL (ref 12.0–15.0)
MCH: 29 pg (ref 26.0–34.0)
MCHC: 34.7 g/dL (ref 30.0–36.0)
MCV: 83.8 fL (ref 80.0–100.0)
Platelets: 228 10*3/uL (ref 150–400)
RBC: 4.82 MIL/uL (ref 3.87–5.11)
RDW: 12.9 % (ref 11.5–15.5)
WBC: 10.6 10*3/uL — ABNORMAL HIGH (ref 4.0–10.5)
nRBC: 0 % (ref 0.0–0.2)

## 2024-06-05 LAB — TROPONIN T, HIGH SENSITIVITY
Troponin T High Sensitivity: 33 ng/L — ABNORMAL HIGH (ref 0–19)
Troponin T High Sensitivity: 35 ng/L — ABNORMAL HIGH (ref 0–19)

## 2024-06-05 LAB — PRO BRAIN NATRIURETIC PEPTIDE: Pro Brain Natriuretic Peptide: 132 pg/mL

## 2024-06-05 MED ORDER — ACETAMINOPHEN 500 MG PO TABS
1000.0000 mg | ORAL_TABLET | Freq: Once | ORAL | Status: AC
  Administered 2024-06-05: 1000 mg via ORAL
  Filled 2024-06-05: qty 2

## 2024-06-05 MED ORDER — FAMOTIDINE IN NACL 20-0.9 MG/50ML-% IV SOLN
20.0000 mg | Freq: Once | INTRAVENOUS | Status: AC
  Administered 2024-06-05: 20 mg via INTRAVENOUS
  Filled 2024-06-05: qty 50

## 2024-06-05 MED ORDER — LIDOCAINE VISCOUS HCL 2 % MT SOLN
15.0000 mL | Freq: Once | OROMUCOSAL | Status: AC
  Administered 2024-06-05: 15 mL via ORAL
  Filled 2024-06-05: qty 15

## 2024-06-05 MED ORDER — NITROFURANTOIN MONOHYD MACRO 100 MG PO CAPS: 100.0000 mg | ORAL_CAPSULE | Freq: Two times a day (BID) | ORAL | 0 refills | Status: AC

## 2024-06-05 MED ORDER — ALUM & MAG HYDROXIDE-SIMETH 200-200-20 MG/5ML PO SUSP
30.0000 mL | Freq: Once | ORAL | Status: AC
  Administered 2024-06-05: 30 mL via ORAL
  Filled 2024-06-05: qty 30

## 2024-06-05 MED ORDER — SODIUM CHLORIDE 0.9 % IV SOLN
1.0000 g | Freq: Once | INTRAVENOUS | Status: AC
  Administered 2024-06-05: 1 g via INTRAVENOUS
  Filled 2024-06-05: qty 10

## 2024-06-05 MED ORDER — PREGABALIN 25 MG PO CAPS
50.0000 mg | ORAL_CAPSULE | ORAL | Status: AC
  Administered 2024-06-05: 50 mg via ORAL
  Filled 2024-06-05: qty 2

## 2024-06-05 NOTE — ED Triage Notes (Addendum)
 Pt POV with daughter, complaint of UTI symptoms, pain and frequency since last Thursday. Pt also complaining of SOB.

## 2024-06-05 NOTE — ED Provider Notes (Signed)
 " [place] EMERGENCY DEPARTMENT AT [facility] Provider Note   CSN: [id number] Arrival date & time: 06/05/24  1229     Patient presents with: Urinary Frequency   Jasmine Kelley is a 79 y.o. female.  {Add pertinent medical, surgical, social history, OB history to [id number]}  Urinary Frequency          Prior to Admission medications  Medication Sig Start Date End Date Taking? Authorizing Provider  alendronate (FOSAMAX) 70 MG tablet Take 70 mg by mouth once a week. 01/16/15   [provider]  amLODipine  (NORVASC ) 5 MG tablet Take 0.5 tablets (2.5 mg total) by mouth daily. 06/15/23   Parthenia Olivia HERO, PA-C  azelastine (ASTELIN) 0.1 % nasal spray 1 puff in each nostril 07/05/15   [provider]  DULoxetine (CYMBALTA) 60 MG capsule Take 1 tablet by mouth daily.    [provider]  ferrous fumarate (HEMOCYTE - 106 MG FE) 325 (106 FE) MG TABS tablet Take 1 tablet by mouth daily.    [provider]  gabapentin (NEURONTIN) 100 MG capsule Take 100-200 mg by mouth at bedtime. 08/07/21   [provider]  LORazepam  (ATIVAN ) 0.5 MG tablet 1/2-1  tablet as needed Patient not taking: Reported on 07/14/2023 03/08/15   [provider]  LORazepam  (ATIVAN ) 1 MG tablet Take 0.5 tablets (0.5 mg total) by mouth every 6 (six) hours as needed for anxiety. Patient not taking: Reported on 07/14/2023 05/03/14   Armenta Canning, MD  simvastatin (ZOCOR) 10 MG tablet every evening.    [provider]  SYNTHROID 100 MCG tablet  05/14/16   [provider]  valACYclovir (VALTREX) 500 MG tablet Take 500 mg by mouth daily as needed (blisters).  10/28/13   [provider]  valsartan (DIOVAN) 80 MG tablet 1 tablet 02/13/21   [provider]  Vitamin D, Ergocalciferol, 50000 units CAPS 1 capsule 02/13/21   [provider]  warfarin (COUMADIN ) 2 MG tablet Take 1 to 1.5 (one and one half) tablets by mouth daily as  directed by the Anticoagulation Clinic. 02/11/24   Parthenia Olivia HERO, PA-C    Allergies: Bee venom, Benadryl [diphenhydramine hcl (sleep)], Buspirone, Chlorpromazine hcl, Chlorpromazine hcl, Diphenhydramine hcl, Mirtazapine, Other, and Peanut butter flavoring agent (non-screening)    Review of Systems  Genitourinary:  Positive for frequency.    Updated Vital Signs BP (!) 169/71 (BP Location: Right Arm)   Pulse 82   Temp 98.4 F (36.9 C) (Oral)   Resp 20   Ht 5' 8 (1.727 m)   Wt 68.9 kg   SpO2 100%   BMI 23.11 kg/m   Physical Exam  (all labs ordered are listed, but only abnormal results are displayed) Labs Reviewed  COMPREHENSIVE METABOLIC PANEL WITH GFR - Abnormal; Notable for the following components:      Result Value   AST 58 (*)    ALT 55 (*)    All other components within normal limits  CBC - Abnormal; Notable for the following components:   WBC 10.6 (*)    All other components within normal limits  PROTIME-INR - Abnormal; Notable for the following components:   Prothrombin Time 24.6 (*)    INR 2.1 (*)    All other components within normal limits  URINALYSIS, ROUTINE W REFLEX MICROSCOPIC - Abnormal; Notable for the following components:   APPearance CLOUDY (*)    Hgb urine dipstick MODERATE (*)    Protein, ur 100 (*)  Nitrite POSITIVE (*)    Leukocytes,Ua LARGE (*)    Bacteria, UA MANY (*)    All other components within normal limits  TROPONIN T, HIGH SENSITIVITY - Abnormal; Notable for the following components:   Troponin T High Sensitivity 33 (*)    All other components within normal limits  URINE CULTURE  PRO BRAIN NATRIURETIC PEPTIDE    EKG: EKG Interpretation Date/Time:  Monday June 05 2024 12:41:51 EST Ventricular Rate:  80 PR Interval:  176 QRS Duration:  93 QT Interval:  368 QTC Calculation: 425 R Axis:   41  Text Interpretation: Sinus rhythm Borderline repolarization abnormality Confirmed by Jerrol Agent ([id number]) on 06/05/2024 12:44:12  PM  Radiology: DG Chest 2 View Result Date: 06/05/2024 CLINICAL DATA:  Shortness of breath EXAM: CHEST - 2 VIEW COMPARISON:  12/28/2019 and the CTA chest of 03/16/2024. FINDINGS: Median sternotomy for mitral valve repair. Wires and leads project over the chest on the frontal radiograph. Normal heart size. Atherosclerosis in the transverse aorta. No pleural effusion or pneumothorax. Biapical pleural thickening. Left lower lobe scarring. IMPRESSION: 1. No acute cardiopulmonary disease. 2. Aortic Atherosclerosis (ICD10-I70.0). Electronically Signed   By: Rockey Kilts M.D.   On: 06/05/2024 14:42    {Document cardiac monitor, telemetry assessment procedure when appropriate:32947} Procedures   Medications Ordered in the ED  cefTRIAXone  (ROCEPHIN ) 1 g in sodium chloride  0.9 % 100 mL IVPB (has no administration in time range)  acetaminophen  (TYLENOL ) tablet 1,000 mg (1,000 mg Oral Given 06/05/24 1400)  pregabalin  (LYRICA ) capsule 50 mg (50 mg Oral Given 06/05/24 1400)      {Click here for ABCD2, HEART and other calculators REFRESH Note before signing:1}                              Medical Decision Making Amount and/or Complexity of Data Reviewed Labs: ordered. Radiology: ordered.  Risk OTC drugs. Prescription drug management.   ***  {Document critical care time when appropriate  Document review of labs and clinical decision tools ie CHADS2VASC2, etc  Document your independent review of radiology images and any outside records  Document your discussion with family members, caretakers and with consultants  Document social determinants of health affecting pt's care  Document your decision making why or why not admission, treatments were needed:32947:::1}   Final diagnoses:  None    ED Discharge Orders     None        "

## 2024-06-07 LAB — URINE CULTURE: Culture: >=100000 — AB

## 2024-06-08 ENCOUNTER — Telehealth (HOSPITAL_BASED_OUTPATIENT_CLINIC_OR_DEPARTMENT_OTHER): Payer: Self-pay | Admitting: *Deleted

## 2024-06-08 NOTE — Telephone Encounter (Signed)
 Post ED Visit - Positive Culture Follow-up  Culture report reviewed by antimicrobial stewardship pharmacist: [facility] [x]  Rankin Sams, [place].D. []  Venetia Gully, Pharm.D., BCPS AQ-ID []  Garrel Crews, Pharm.D., BCPS []  Almarie Lunger, [address].D., BCPS []  [place], [address].D., BCPS, AAHIVP []  Rosaline Bihari, Pharm.D., BCPS, AAHIVP []  Vernell Meier, PharmD, BCPS []  Latanya Hint, PharmD, BCPS []  Donald Medley, PharmD, BCPS []  Rocky Bold, PharmD []  Dorothyann Alert, PharmD, BCPS []  Morene Babe, PharmD  [facility] []  Rosaline Edison, PharmD []  Romona Bliss, PharmD []  Dolphus Roller, PharmD []  Veva Seip, Rph []  Vernell Daunt) Leonce, PharmD []  Eva Allis, PharmD []  Rosaline Millet, PharmD []  Iantha Batch, PharmD []  Arvin Gauss, PharmD []  Wanda Hasting, PharmD []  Ronal Rav, PharmD []  Rocky Slade, PharmD []  Bard Jeans, PharmD   Positive urine culture Treated with nitrofurantoin , organism sensitive to the same and no further patient follow-up is required at this time.  Jasmine Kelley 06/08/2024, 12:41 PM
